# Patient Record
Sex: Female | Born: 1965 | Race: Black or African American | Hispanic: No | Marital: Single | Smoking: Former smoker
Health system: Southern US, Community
[De-identification: ages and names within clinical notes are randomized; demographics above are authoritative.]

## PROBLEM LIST (undated history)

## (undated) DIAGNOSIS — Z21 Asymptomatic human immunodeficiency virus [HIV] infection status: Secondary | ICD-10-CM

## (undated) DIAGNOSIS — Z87828 Personal history of other (healed) physical injury and trauma: Secondary | ICD-10-CM

## (undated) DIAGNOSIS — F259 Schizoaffective disorder, unspecified: Secondary | ICD-10-CM

## (undated) DIAGNOSIS — B181 Chronic viral hepatitis B without delta-agent: Secondary | ICD-10-CM

## (undated) DIAGNOSIS — F319 Bipolar disorder, unspecified: Secondary | ICD-10-CM

## (undated) DIAGNOSIS — B1920 Unspecified viral hepatitis C without hepatic coma: Secondary | ICD-10-CM

## (undated) DIAGNOSIS — B20 Human immunodeficiency virus [HIV] disease: Secondary | ICD-10-CM

## (undated) DIAGNOSIS — F71 Moderate intellectual disabilities: Secondary | ICD-10-CM

## (undated) HISTORY — PX: OTHER SURGICAL HISTORY: SHX169

## (undated) HISTORY — DX: Moderate intellectual disabilities: F71

## (undated) HISTORY — DX: Bipolar disorder, unspecified: F31.9

## (undated) HISTORY — DX: Schizoaffective disorder, unspecified: F25.9

## (undated) HISTORY — DX: Asymptomatic human immunodeficiency virus (hiv) infection status: Z21

## (undated) HISTORY — DX: Human immunodeficiency virus (HIV) disease: B20

## (undated) HISTORY — DX: Personal history of other (healed) physical injury and trauma: Z87.828

## (undated) HISTORY — DX: Chronic viral hepatitis B without delta-agent: B18.1

## (undated) HISTORY — DX: Unspecified viral hepatitis C without hepatic coma: B19.20

---

## 2009-01-09 ENCOUNTER — Emergency Department (HOSPITAL_COMMUNITY): Admission: EM | Admit: 2009-01-09 | Discharge: 2009-01-09 | Payer: Self-pay | Admitting: Emergency Medicine

## 2009-01-22 ENCOUNTER — Encounter: Payer: Self-pay | Admitting: Internal Medicine

## 2009-01-29 ENCOUNTER — Ambulatory Visit: Payer: Self-pay | Admitting: Internal Medicine

## 2009-01-29 DIAGNOSIS — F70 Mild intellectual disabilities: Secondary | ICD-10-CM | POA: Insufficient documentation

## 2009-01-29 DIAGNOSIS — B20 Human immunodeficiency virus [HIV] disease: Secondary | ICD-10-CM

## 2009-01-29 DIAGNOSIS — J301 Allergic rhinitis due to pollen: Secondary | ICD-10-CM | POA: Insufficient documentation

## 2009-01-29 DIAGNOSIS — B1910 Unspecified viral hepatitis B without hepatic coma: Secondary | ICD-10-CM | POA: Insufficient documentation

## 2009-01-29 DIAGNOSIS — F603 Borderline personality disorder: Secondary | ICD-10-CM | POA: Insufficient documentation

## 2009-01-29 DIAGNOSIS — F259 Schizoaffective disorder, unspecified: Secondary | ICD-10-CM | POA: Insufficient documentation

## 2009-01-29 LAB — CONVERTED CEMR LAB: HIV 1 RNA Quant: <48 copies/mL (ref <48)

## 2009-01-30 ENCOUNTER — Emergency Department (HOSPITAL_COMMUNITY): Admission: EM | Admit: 2009-01-30 | Discharge: 2009-01-30 | Payer: Self-pay | Admitting: Emergency Medicine

## 2009-01-30 ENCOUNTER — Emergency Department (HOSPITAL_COMMUNITY): Admission: EM | Admit: 2009-01-30 | Discharge: 2009-01-31 | Payer: Self-pay | Admitting: Emergency Medicine

## 2009-02-01 ENCOUNTER — Emergency Department (HOSPITAL_COMMUNITY): Admission: EM | Admit: 2009-02-01 | Discharge: 2009-02-02 | Payer: Self-pay | Admitting: Emergency Medicine

## 2009-02-03 LAB — CONVERTED CEMR LAB
AST: 13 units/L (ref 0–37)
Albumin: 4.3 g/dL (ref 3.5–5.2)
Alkaline Phosphatase: 41 units/L (ref 39–117)
BUN: 13 mg/dL (ref 6–23)
Eosinophils Absolute: 0.1 10*3/uL (ref 0.0–0.7)
Eosinophils Relative: 1 % (ref 0–5)
GC Probe Amp, Urine: NEGATIVE
HCT: 39.8 % (ref 36.0–46.0)
HDL: 49 mg/dL (ref >39)
HIV-1 antibody: POSITIVE — AB
HIV-2 Ab: NEGATIVE
HIV: REACTIVE
Hemoglobin, Urine: NEGATIVE
Hemoglobin: 13.5 g/dL (ref 12.0–15.0)
Hep B S Ab: POSITIVE — AB
LDL Cholesterol: 30 mg/dL (ref 0–99)
Leukocytes, UA: NEGATIVE
Lymphocytes Relative: 31 % (ref 12–46)
Lymphs Abs: 2.1 10*3/uL (ref 0.7–4.0)
MCV: 97.1 fL (ref 78.0–100.0)
Monocytes Absolute: 0.6 10*3/uL (ref 0.1–1.0)
Monocytes Relative: 8 % (ref 3–12)
Platelets: 201 10*3/uL (ref 150–400)
Potassium: 3.6 meq/L (ref 3.5–5.3)
Protein, ur: NEGATIVE mg/dL
Sodium: 142 meq/L (ref 135–145)
Total Bilirubin: 4.9 mg/dL — ABNORMAL HIGH (ref 0.3–1.2)
Total CHOL/HDL Ratio: 2
Triglycerides: 106 mg/dL (ref <150)
Urine Glucose: NEGATIVE mg/dL
Urobilinogen, UA: 1 (ref 0.0–1.0)
VLDL: 21 mg/dL (ref 0–40)
Valproic Acid Lvl: 68.8 ug/mL (ref 50.0–100.0)
WBC: 6.9 10*3/uL (ref 4.0–10.5)

## 2009-02-05 ENCOUNTER — Encounter: Payer: Self-pay | Admitting: Internal Medicine

## 2009-02-26 ENCOUNTER — Encounter: Payer: Self-pay | Admitting: Internal Medicine

## 2009-02-26 ENCOUNTER — Telehealth: Payer: Self-pay

## 2009-05-21 ENCOUNTER — Emergency Department (HOSPITAL_COMMUNITY): Admission: EM | Admit: 2009-05-21 | Discharge: 2009-05-22 | Payer: Self-pay | Admitting: Emergency Medicine

## 2009-07-30 ENCOUNTER — Encounter: Payer: Self-pay | Admitting: *Deleted

## 2009-11-21 ENCOUNTER — Emergency Department (HOSPITAL_COMMUNITY): Admission: EM | Admit: 2009-11-21 | Discharge: 2009-11-21 | Payer: Self-pay | Admitting: Emergency Medicine

## 2009-11-24 ENCOUNTER — Emergency Department (HOSPITAL_COMMUNITY): Admission: EM | Admit: 2009-11-24 | Discharge: 2009-11-24 | Payer: Self-pay | Admitting: Emergency Medicine

## 2010-08-23 NOTE — Miscellaneous (Signed)
  Clinical Lists Changes  Observations: Added new observation of PAYOR: Medicaid (07/30/2009 15:55)

## 2010-10-18 ENCOUNTER — Emergency Department: Payer: Self-pay | Admitting: Emergency Medicine

## 2010-10-30 LAB — RAPID URINE DRUG SCREEN, HOSP PERFORMED
Amphetamines: NOT DETECTED
Barbiturates: NOT DETECTED
Barbiturates: NOT DETECTED
Benzodiazepines: POSITIVE — AB
Tetrahydrocannabinol: NOT DETECTED

## 2010-10-30 LAB — URINALYSIS, ROUTINE W REFLEX MICROSCOPIC
Bilirubin Urine: NEGATIVE
Glucose, UA: NEGATIVE mg/dL
Hgb urine dipstick: NEGATIVE
Nitrite: NEGATIVE
Protein, ur: NEGATIVE mg/dL
Protein, ur: NEGATIVE mg/dL
Specific Gravity, Urine: >1.03 — ABNORMAL HIGH (ref 1.005–1.030)
Urobilinogen, UA: 0.2 mg/dL (ref 0.0–1.0)

## 2010-10-30 LAB — BASIC METABOLIC PANEL
BUN: 11 mg/dL (ref 6–23)
CO2: 28 mEq/L (ref 19–32)
Chloride: 105 mEq/L (ref 96–112)
Chloride: 106 mEq/L (ref 96–112)
Creatinine, Ser: 0.87 mg/dL (ref 0.4–1.2)
GFR calc Af Amer: >60 mL/min (ref >60)
Potassium: 3.5 mEq/L (ref 3.5–5.1)

## 2010-10-30 LAB — DIFFERENTIAL
Basophils Relative: 0 % (ref 0–1)
Eosinophils Absolute: 0 10*3/uL (ref 0.0–0.7)
Eosinophils Absolute: 0.1 10*3/uL (ref 0.0–0.7)
Eosinophils Relative: 2 % (ref 0–5)
Lymphs Abs: 1.7 10*3/uL (ref 0.7–4.0)
Lymphs Abs: 2.7 10*3/uL (ref 0.7–4.0)
Monocytes Absolute: 0.5 10*3/uL (ref 0.1–1.0)
Monocytes Relative: 8 % (ref 3–12)
Neutrophils Relative %: 67 % (ref 43–77)

## 2010-10-30 LAB — POCT I-STAT, CHEM 8
BUN: 10 mg/dL (ref 6–23)
Creatinine, Ser: 0.7 mg/dL (ref 0.4–1.2)
Glucose, Bld: 102 mg/dL — ABNORMAL HIGH (ref 70–99)
Potassium: 3.6 mEq/L (ref 3.5–5.1)
Sodium: 141 mEq/L (ref 135–145)

## 2010-10-30 LAB — T-HELPER CELL (CD4) - (RCID CLINIC ONLY)
CD4 % Helper T Cell: 50 % (ref 33–55)
CD4 T Cell Abs: 990 uL (ref 400–2700)

## 2010-10-30 LAB — CBC
HCT: 36.5 % (ref 36.0–46.0)
Hemoglobin: 12.9 g/dL (ref 12.0–15.0)
MCHC: 35.3 g/dL (ref 30.0–36.0)
MCV: 98.1 fL (ref 78.0–100.0)
MCV: 99.4 fL (ref 78.0–100.0)
Platelets: 205 10*3/uL (ref 150–400)
RBC: 3.67 MIL/uL — ABNORMAL LOW (ref 3.87–5.11)
WBC: 6.7 10*3/uL (ref 4.0–10.5)
WBC: 6.9 10*3/uL (ref 4.0–10.5)

## 2010-10-30 LAB — ETHANOL
Alcohol, Ethyl (B): <5 mg/dL (ref 0–10)
Alcohol, Ethyl (B): <5 mg/dL (ref 0–10)

## 2010-10-30 LAB — PREGNANCY, URINE: Preg Test, Ur: NEGATIVE

## 2010-10-31 LAB — CBC
HCT: 37.9 % (ref 36.0–46.0)
MCV: 98.4 fL (ref 78.0–100.0)
Platelets: 232 10*3/uL (ref 150–400)
RDW: 12.2 % (ref 11.5–15.5)

## 2010-10-31 LAB — URINALYSIS, ROUTINE W REFLEX MICROSCOPIC: Leukocytes, UA: NEGATIVE

## 2010-10-31 LAB — URINE MICROSCOPIC-ADD ON

## 2010-10-31 LAB — BASIC METABOLIC PANEL
BUN: 17 mg/dL (ref 6–23)
Chloride: 103 mEq/L (ref 96–112)
Creatinine, Ser: 0.79 mg/dL (ref 0.4–1.2)
GFR calc non Af Amer: >60 mL/min (ref >60)
Glucose, Bld: 129 mg/dL — ABNORMAL HIGH (ref 70–99)
Potassium: 3.6 mEq/L (ref 3.5–5.1)

## 2010-10-31 LAB — PREGNANCY, URINE: Preg Test, Ur: NEGATIVE

## 2010-10-31 LAB — URINE CULTURE

## 2010-10-31 LAB — RAPID URINE DRUG SCREEN, HOSP PERFORMED
Benzodiazepines: NOT DETECTED
Cocaine: NOT DETECTED
Opiates: NOT DETECTED
Tetrahydrocannabinol: NOT DETECTED

## 2010-10-31 LAB — DIFFERENTIAL
Basophils Absolute: 0 10*3/uL (ref 0.0–0.1)
Eosinophils Absolute: 0.1 10*3/uL (ref 0.0–0.7)
Eosinophils Relative: 2 % (ref 0–5)
Neutrophils Relative %: 63 % (ref 43–77)

## 2011-01-29 ENCOUNTER — Emergency Department: Payer: Self-pay | Admitting: Emergency Medicine

## 2011-03-08 ENCOUNTER — Emergency Department: Payer: Self-pay | Admitting: *Deleted

## 2011-03-23 ENCOUNTER — Emergency Department: Payer: Self-pay | Admitting: Emergency Medicine

## 2011-03-29 ENCOUNTER — Ambulatory Visit: Payer: Self-pay | Admitting: Family

## 2012-09-26 ENCOUNTER — Emergency Department: Payer: Self-pay | Admitting: Internal Medicine

## 2013-04-04 ENCOUNTER — Other Ambulatory Visit: Payer: Self-pay | Admitting: Obstetrics and Gynecology

## 2013-04-07 ENCOUNTER — Other Ambulatory Visit (HOSPITAL_COMMUNITY): Payer: Self-pay | Admitting: Family Medicine

## 2013-04-07 DIAGNOSIS — Z139 Encounter for screening, unspecified: Secondary | ICD-10-CM

## 2013-04-11 ENCOUNTER — Ambulatory Visit (HOSPITAL_COMMUNITY)
Admission: RE | Admit: 2013-04-11 | Discharge: 2013-04-11 | Disposition: A | Discharge status: Home / Self Care | Payer: Medicaid Other | Source: Ambulatory Visit | Attending: Family Medicine | Admitting: Family Medicine

## 2013-04-11 DIAGNOSIS — Z1231 Encounter for screening mammogram for malignant neoplasm of breast: Secondary | ICD-10-CM | POA: Insufficient documentation

## 2013-04-11 DIAGNOSIS — Z139 Encounter for screening, unspecified: Secondary | ICD-10-CM

## 2013-04-18 ENCOUNTER — Other Ambulatory Visit (HOSPITAL_COMMUNITY)
Admission: RE | Admit: 2013-04-18 | Discharge: 2013-04-18 | Disposition: A | Discharge status: Home / Self Care | Payer: Medicaid Other | Source: Ambulatory Visit | Attending: Obstetrics and Gynecology | Admitting: Obstetrics and Gynecology

## 2013-04-18 ENCOUNTER — Ambulatory Visit (INDEPENDENT_AMBULATORY_CARE_PROVIDER_SITE_OTHER): Payer: Medicaid Other | Admitting: Obstetrics and Gynecology

## 2013-04-18 ENCOUNTER — Encounter: Payer: Self-pay | Admitting: Obstetrics and Gynecology

## 2013-04-18 VITALS — BP 130/82 | Ht 62.0 in | Wt 192.0 lb

## 2013-04-18 DIAGNOSIS — Z01419 Encounter for gynecological examination (general) (routine) without abnormal findings: Secondary | ICD-10-CM

## 2013-04-18 DIAGNOSIS — Z113 Encounter for screening for infections with a predominantly sexual mode of transmission: Secondary | ICD-10-CM | POA: Insufficient documentation

## 2013-04-18 DIAGNOSIS — Z0000 Encounter for general adult medical examination without abnormal findings: Secondary | ICD-10-CM

## 2013-04-18 DIAGNOSIS — Z1212 Encounter for screening for malignant neoplasm of rectum: Secondary | ICD-10-CM

## 2013-04-18 DIAGNOSIS — Z1151 Encounter for screening for human papillomavirus (HPV): Secondary | ICD-10-CM | POA: Insufficient documentation

## 2013-04-18 LAB — HEMOCCULT GUIAC POC 1CARD (OFFICE)

## 2013-04-18 NOTE — Progress Notes (Addendum)
Patient ID: Karen Mcguire, female   DOB: Aug 28, 1965, 47 y.o.   MRN: [id number] Pt seen in office earlier today. Pt's nurse came by the office and asked if Dr. Emelda Fear could refer the pt to and Infection control doctor around here. The nurse stated that it was time for her blood work and check up. She also stated the pt has been seen every 6 months in [place]. She stated that the home that the pt lives in doesn't want to drive that far if it can be helped. I spoke with Dr. Emelda Fear and he advised that when the pt is seen again he would take care of it then.   Wilburn [phone] Nurse.  I left a message for Karen Mcguire to call me back on Monday.

## 2013-04-18 NOTE — Progress Notes (Signed)
Patient ID: Karen Mcguire, Karen Mcguire   Assessment:  Annual Gyn Exam hiv pos hcv pos VULVAR SKINN TAG, FOR FUTURE REMOVAL CERVICAL LESION WILL BX IN FUTURE, PAP TODAY   Plan:  1. pap smear done, next pap due yearly 2. return annually or prn 3    Annual mammogram advised Subjective:  Karen Mcguire is a 47 y.o. female No obstetric history on file. who presents for annual exam. No LMP recorded. Patient has had an injection. The patient has complaints today of none  The following portions of the patient's history were reviewed and updated as appropriate: allergies, current medications, past family history, past medical history, past social history, past surgical history and problem list.  Review of Systems Constitutional: negative Gastrointestinal: negative Genitourinary: has a bump on rt labia majora  Objective:  BP 130/82  Ht 5\' 2"  (1.575 m)  Wt 192 lb (87.091 kg)  BMI 35.11 kg/m2   BMI: Body mass index is 35.11 kg/(m^2).  General Appearance: Alert, appropriate appearance for age. No acute distress HEENT: Grossly normal Neck / Thyroid:  Cardiovascular: RRR; normal S1, S2, no murmur Lungs: CTA bilaterally Back: No CVAT Breast Exam: No masses or nodes.No dimpling, nipple retraction or discharge. Gastrointestinal: Soft, non-tender, no masses or organomegaly Pelvic Exam: Vulva and vagina appear normal. Bimanual exam reveals normal uterus and adnexa. Cervix: GROWTH ON LEFT OF CX 3 OCLOCK, ? NABOTHIAN CYST, VSCONDYLOMA Rectovaginal: normal rectal, no masses and guaiac negative stool obtained Lymphatic Exam: Non-palpable nodes in neck, clavicular, axillary, or inguinal regions Skin: no rash or abnormalities Neurologic: Normal gait and speech, no tremor  Psychiatric: Alert and oriented, appropriate affect.  Urinalysis:Not done  Christin Bach. MD Pgr [phone] 1:52 PM

## 2013-04-18 NOTE — Patient Instructions (Signed)
RETURN FOR BIOPSY

## 2013-05-08 ENCOUNTER — Ambulatory Visit (INDEPENDENT_AMBULATORY_CARE_PROVIDER_SITE_OTHER): Payer: Medicaid Other | Admitting: Obstetrics and Gynecology

## 2013-05-08 VITALS — BP 110/80 | Ht 62.0 in | Wt 195.0 lb

## 2013-05-08 DIAGNOSIS — A630 Anogenital (venereal) warts: Secondary | ICD-10-CM

## 2013-05-08 DIAGNOSIS — Z3202 Encounter for pregnancy test, result negative: Secondary | ICD-10-CM

## 2013-05-08 DIAGNOSIS — Z3200 Encounter for pregnancy test, result unknown: Secondary | ICD-10-CM

## 2013-05-08 DIAGNOSIS — D280 Benign neoplasm of vulva: Secondary | ICD-10-CM

## 2013-05-08 NOTE — Progress Notes (Signed)
Patient ID: Karen Mcguire, female   DOB: 02-01-1966, 47 y.o.   MRN: [id number] Pt here today for vulvar and cervical biopsy. Pt has a negative UPT. Pt needs to have a referral to Dr. Luciana Axe for HIV/ Hep B chronic. Phone number [phone].  VULVAR BIOPSY NOTE, also cervix biopsy The indications for vulvar biopsy (rule out neoplasia,  diagnosis) were reviewed.   Risks of the biopsy including pain, bleeding, infection, inadequate specimen, scarring and need for additional procedures  were discussed. The patient stated understanding and agreed to undergo procedure today. Consent was signed,  time out performed.  The patient's vulva was prepped with Betadine. 1% lidocaine with epinephrine was injected into right labia majora and cervix. A biopsy was done, biopsy tissue was picked up with sterile forceps and sterile scissors were used to excise the lesion.  Small bleeding was noted and hemostasis was achieved using silver nitrate sticks.  The patient tolerated the procedure well. Post-procedure instructions  (pelvic rest for one week) were given to the patient. The patient is to call with heavy bleeding, fever greater than 100.4, foul smelling vaginal discharge or other concerns. The patient will be return to clinic in two weeks for discussion of results.  Cervix biopsy under local removed the entire 1 cm x 1 cm x 5 mm thick lesion on cervix at 2 oclock in pieces. Monsels applied.

## 2013-05-08 NOTE — Patient Instructions (Addendum)
Expect results in 1 week. Biopsies of vulva and cervix performed

## 2013-05-14 ENCOUNTER — Telehealth: Payer: Self-pay | Admitting: *Deleted

## 2013-05-14 NOTE — Telephone Encounter (Signed)
Message copied by Criss Alvine on Wed May 14, 2013  4:55 PM ------      Message from: Tilda Burrow      Created: Wed May 14, 2013  6:40 AM       BIOPSY BENIGN ON CERVIX, VULVAR WART CONDYLOMA, REMOVED ENTIRELY.      PLEASE NOTIFY PT OR CAREGIVER ------

## 2013-05-14 NOTE — Telephone Encounter (Signed)
Spoke with Oliva Bustard, pt caregiver, informed per Dr. Emelda Fear cervical biopsy benign, and vulvar wart, condyloma and entirely removed.

## 2013-06-06 ENCOUNTER — Ambulatory Visit (INDEPENDENT_AMBULATORY_CARE_PROVIDER_SITE_OTHER): Payer: Medicaid Other | Admitting: Obstetrics and Gynecology

## 2013-06-06 ENCOUNTER — Encounter: Payer: Self-pay | Admitting: Obstetrics and Gynecology

## 2013-06-06 ENCOUNTER — Encounter (INDEPENDENT_AMBULATORY_CARE_PROVIDER_SITE_OTHER): Payer: Self-pay

## 2013-06-06 VITALS — BP 120/80 | Ht 64.0 in | Wt 197.0 lb

## 2013-06-06 DIAGNOSIS — A630 Anogenital (venereal) warts: Secondary | ICD-10-CM

## 2013-06-06 NOTE — Progress Notes (Signed)
Pathology:  Vulvar condyloma only                  Cervix biopsies:  No dysplasia  A: no vulvar pathology Followup annual pap

## 2013-06-06 NOTE — Patient Instructions (Signed)
Your biopsies were benign.  The cervix biopsies were completely normal     The spot on  Your bottom were condyloma([place]) with NO cancer.

## 2013-07-03 ENCOUNTER — Telehealth: Payer: Self-pay | Admitting: *Deleted

## 2013-07-04 NOTE — Telephone Encounter (Signed)
Phone message left with Dr comer's office, requesting patient be contacted for followup re: HIV meds. Patient contact numbers left with message.

## 2013-07-07 ENCOUNTER — Telehealth: Payer: Self-pay | Admitting: *Deleted

## 2013-07-07 NOTE — Telephone Encounter (Signed)
Dr. Christin Bach, patient's GYN at [facility], called requesting an appointment for patient who is HIV+/HepB+.  Patient had been receiving care at [facility], but would like to transfer care to [facility]. Andree Coss, RN

## 2013-07-09 ENCOUNTER — Encounter (HOSPITAL_COMMUNITY): Payer: Self-pay | Admitting: Emergency Medicine

## 2013-07-09 ENCOUNTER — Emergency Department (HOSPITAL_COMMUNITY)
Admission: EM | Admit: 2013-07-09 | Discharge: 2013-07-11 | Disposition: A | Discharge status: Home / Self Care | Payer: Medicaid Other | Attending: Emergency Medicine | Admitting: Emergency Medicine

## 2013-07-09 DIAGNOSIS — Z79899 Other long term (current) drug therapy: Secondary | ICD-10-CM | POA: Insufficient documentation

## 2013-07-09 DIAGNOSIS — R443 Hallucinations, unspecified: Secondary | ICD-10-CM

## 2013-07-09 DIAGNOSIS — F319 Bipolar disorder, unspecified: Secondary | ICD-10-CM | POA: Insufficient documentation

## 2013-07-09 DIAGNOSIS — F259 Schizoaffective disorder, unspecified: Secondary | ICD-10-CM

## 2013-07-09 DIAGNOSIS — Z3202 Encounter for pregnancy test, result negative: Secondary | ICD-10-CM | POA: Insufficient documentation

## 2013-07-09 DIAGNOSIS — F17200 Nicotine dependence, unspecified, uncomplicated: Secondary | ICD-10-CM | POA: Insufficient documentation

## 2013-07-09 DIAGNOSIS — Z21 Asymptomatic human immunodeficiency virus [HIV] infection status: Secondary | ICD-10-CM | POA: Insufficient documentation

## 2013-07-09 DIAGNOSIS — Z8619 Personal history of other infectious and parasitic diseases: Secondary | ICD-10-CM | POA: Insufficient documentation

## 2013-07-09 DIAGNOSIS — F603 Borderline personality disorder: Secondary | ICD-10-CM | POA: Diagnosis present

## 2013-07-09 LAB — COMPREHENSIVE METABOLIC PANEL
ALT: 11 U/L (ref 0–35)
Alkaline Phosphatase: 51 U/L (ref 39–117)
BUN: 7 mg/dL (ref 6–23)
CO2: 22 mEq/L (ref 19–32)
Calcium: 9.4 mg/dL (ref 8.4–10.5)
Chloride: 103 mEq/L (ref 96–112)
Creatinine, Ser: 0.75 mg/dL (ref 0.50–1.10)
GFR calc Af Amer: >90 mL/min (ref >90)
GFR calc non Af Amer: >90 mL/min (ref >90)
Glucose, Bld: 109 mg/dL — ABNORMAL HIGH (ref 70–99)
Potassium: 3.7 mEq/L (ref 3.5–5.1)
Total Bilirubin: 0.2 mg/dL — ABNORMAL LOW (ref 0.3–1.2)

## 2013-07-09 LAB — SALICYLATE LEVEL: Salicylate Lvl: <2 mg/dL — ABNORMAL LOW (ref 2.8–20.0)

## 2013-07-09 LAB — CBC WITH DIFFERENTIAL/PLATELET
Basophils Relative: 0 % (ref 0–1)
Eosinophils Relative: 1 % (ref 0–5)
HCT: 41.2 % (ref 36.0–46.0)
Hemoglobin: 14 g/dL (ref 12.0–15.0)
Lymphocytes Relative: 31 % (ref 12–46)
Lymphs Abs: 2.4 10*3/uL (ref 0.7–4.0)
MCHC: 34 g/dL (ref 30.0–36.0)
MCV: 97.4 fL (ref 78.0–100.0)
Monocytes Absolute: 0.6 10*3/uL (ref 0.1–1.0)
Monocytes Relative: 8 % (ref 3–12)
Neutro Abs: 4.7 10*3/uL (ref 1.7–7.7)
RBC: 4.23 MIL/uL (ref 3.87–5.11)
RDW: 13.1 % (ref 11.5–15.5)
WBC: 7.9 10*3/uL (ref 4.0–10.5)

## 2013-07-09 LAB — RAPID URINE DRUG SCREEN, HOSP PERFORMED
Cocaine: NOT DETECTED
Opiates: NOT DETECTED
Tetrahydrocannabinol: NOT DETECTED

## 2013-07-09 LAB — URINALYSIS, ROUTINE W REFLEX MICROSCOPIC
Glucose, UA: NEGATIVE mg/dL
Ketones, ur: NEGATIVE mg/dL
Leukocytes, UA: NEGATIVE
Nitrite: NEGATIVE
Protein, ur: NEGATIVE mg/dL
pH: 6 (ref 5.0–8.0)

## 2013-07-09 LAB — URINE MICROSCOPIC-ADD ON

## 2013-07-09 LAB — LITHIUM LEVEL: Lithium Lvl: <0.25 mEq/L — ABNORMAL LOW (ref 0.80–1.40)

## 2013-07-09 LAB — POCT PREGNANCY, URINE: Preg Test, Ur: NEGATIVE

## 2013-07-09 LAB — ACETAMINOPHEN LEVEL: Acetaminophen (Tylenol), Serum: <15 ug/mL (ref 10–30)

## 2013-07-09 MED ORDER — DIVALPROEX SODIUM 250 MG PO DR TAB
DELAYED_RELEASE_TABLET | ORAL | Status: AC
  Filled 2013-07-09: qty 1

## 2013-07-09 MED ORDER — LORAZEPAM 1 MG PO TABS
1.0000 mg | ORAL_TABLET | Freq: Three times a day (TID) | ORAL | Status: DC
  Administered 2013-07-10 – 2013-07-11 (×4): 1 mg via ORAL
  Filled 2013-07-09 (×4): qty 1

## 2013-07-09 MED ORDER — DOLUTEGRAVIR SODIUM 50 MG PO TABS
50.0000 mg | ORAL_TABLET | Freq: Every day | ORAL | Status: DC
  Administered 2013-07-10 – 2013-07-11 (×2): 50 mg via ORAL
  Filled 2013-07-09 (×3): qty 1

## 2013-07-09 MED ORDER — LORAZEPAM 2 MG/ML IJ SOLN
INTRAMUSCULAR | Status: AC
  Administered 2013-07-09: 2 mg via INTRAMUSCULAR
  Filled 2013-07-09: qty 1

## 2013-07-09 MED ORDER — LORAZEPAM 2 MG/ML IJ SOLN
2.0000 mg | Freq: Once | INTRAMUSCULAR | Status: AC
  Administered 2013-07-09: 2 mg via INTRAMUSCULAR

## 2013-07-09 MED ORDER — ALUM & MAG HYDROXIDE-SIMETH 200-200-20 MG/5ML PO SUSP: 30.0000 mL | ORAL | Status: DC | PRN

## 2013-07-09 MED ORDER — HALOPERIDOL 5 MG PO TABS
ORAL_TABLET | ORAL | Status: AC
  Filled 2013-07-09: qty 2

## 2013-07-09 MED ORDER — LORAZEPAM 1 MG PO TABS
ORAL_TABLET | ORAL | Status: AC
  Filled 2013-07-09: qty 1

## 2013-07-09 MED ORDER — IBUPROFEN 400 MG PO TABS: 600.0000 mg | ORAL_TABLET | Freq: Three times a day (TID) | ORAL | Status: DC | PRN

## 2013-07-09 MED ORDER — NICOTINE 21 MG/24HR TD PT24
21.0000 mg | MEDICATED_PATCH | Freq: Every day | TRANSDERMAL | Status: DC
  Administered 2013-07-09 – 2013-07-11 (×3): 21 mg via TRANSDERMAL
  Filled 2013-07-09 (×2): qty 1

## 2013-07-09 MED ORDER — OLANZAPINE 5 MG PO TABS
20.0000 mg | ORAL_TABLET | Freq: Every day | ORAL | Status: DC
  Administered 2013-07-09 – 2013-07-10 (×2): 20 mg via ORAL
  Filled 2013-07-09: qty 2
  Filled 2013-07-09 (×2): qty 4
  Filled 2013-07-09: qty 2

## 2013-07-09 MED ORDER — LAMIVUDINE 150 MG PO TABS
300.0000 mg | ORAL_TABLET | Freq: Every day | ORAL | Status: DC
  Administered 2013-07-10 – 2013-07-11 (×2): 300 mg via ORAL
  Filled 2013-07-09 (×3): qty 2

## 2013-07-09 MED ORDER — DIVALPROEX SODIUM 250 MG PO DR TAB
1000.0000 mg | DELAYED_RELEASE_TABLET | Freq: Every day | ORAL | Status: DC
  Administered 2013-07-09 – 2013-07-10 (×2): 1000 mg via ORAL
  Filled 2013-07-09 (×2): qty 4

## 2013-07-09 MED ORDER — NICOTINE 21 MG/24HR TD PT24
MEDICATED_PATCH | TRANSDERMAL | Status: AC
  Filled 2013-07-09: qty 1

## 2013-07-09 MED ORDER — HALOPERIDOL LACTATE 5 MG/ML IJ SOLN
INTRAMUSCULAR | Status: AC
  Administered 2013-07-09: 5 mg
  Filled 2013-07-09: qty 1

## 2013-07-09 MED ORDER — ONDANSETRON HCL 4 MG PO TABS: 4.0000 mg | ORAL_TABLET | Freq: Three times a day (TID) | ORAL | Status: DC | PRN

## 2013-07-09 MED ORDER — BENZTROPINE MESYLATE 1 MG PO TABS: 1.0000 mg | ORAL_TABLET | Freq: Four times a day (QID) | ORAL | Status: DC | PRN

## 2013-07-09 MED ORDER — TRIHEXYPHENIDYL HCL 2 MG PO TABS
2.0000 mg | ORAL_TABLET | Freq: Three times a day (TID) | ORAL | Status: DC
  Administered 2013-07-09 – 2013-07-11 (×4): 2 mg via ORAL
  Filled 2013-07-09 (×12): qty 1

## 2013-07-09 MED ORDER — LITHIUM CARBONATE 300 MG PO CAPS
300.0000 mg | ORAL_CAPSULE | Freq: Two times a day (BID) | ORAL | Status: DC
  Administered 2013-07-09 – 2013-07-11 (×4): 300 mg via ORAL
  Filled 2013-07-09 (×8): qty 1

## 2013-07-09 MED ORDER — DIVALPROEX SODIUM 250 MG PO DR TAB
250.0000 mg | DELAYED_RELEASE_TABLET | Freq: Every day | ORAL | Status: DC
  Administered 2013-07-10: 250 mg via ORAL
  Filled 2013-07-09: qty 1

## 2013-07-09 MED ORDER — HALOPERIDOL 5 MG PO TABS
10.0000 mg | ORAL_TABLET | Freq: Every day | ORAL | Status: DC
  Administered 2013-07-09 – 2013-07-10 (×2): 10 mg via ORAL
  Filled 2013-07-09 (×2): qty 2

## 2013-07-09 MED ORDER — ABACAVIR SULFATE 300 MG PO TABS
600.0000 mg | ORAL_TABLET | Freq: Every day | ORAL | Status: DC
  Administered 2013-07-10 – 2013-07-11 (×2): 600 mg via ORAL
  Filled 2013-07-09 (×4): qty 2

## 2013-07-09 MED ORDER — HYDROXYZINE PAMOATE 25 MG PO CAPS
25.0000 mg | ORAL_CAPSULE | Freq: Every day | ORAL | Status: DC
  Administered 2013-07-10: 25 mg via ORAL
  Filled 2013-07-09 (×2): qty 1
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
## 2013-07-09 NOTE — ED Notes (Addendum)
Patient has been cooperative, has taken her scheduled meds - given part of her 10:00 pm meds while she is cooperative and with knowledge her levels are low and she has not taken her daily meds this am.  Patient has a "stuffed animal - Angie Fava - she has named her RosieBelle
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
## 2013-07-09 NOTE — BH Assessment (Signed)
Writer contacted [facility] to schedule a TTS consult for this patient. Writer spoke to patient's nurse-Tiffany and scheduled the TTS for 1800. Writer asked nurse to place machine in patient's room.  Writer also contacted the examining physician-Kristen Ward for clinical information. My colleague Renda Rolls will complete this patients assessment.

## 2013-07-09 NOTE — ED Notes (Signed)
Pt back in bed.  Still hollaring out some.    Staying in bed.
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
## 2013-07-09 NOTE — ED Notes (Signed)
Pt will not allow NT to get vitals

## 2013-07-09 NOTE — ED Notes (Signed)
Telepsch in progress.

## 2013-07-09 NOTE — ED Notes (Signed)
Patient requesting something to eat, states she did not get but two bites of food earlier today.  Verified with sitter that patient did not get to complete her meal earlier this evening,  Provided with meat and two vegetables, roll, Sprite zero.  Patient eating without complaints.

## 2013-07-09 NOTE — ED Notes (Signed)
Pt hollaring .  Coming running out of room.  Ran to nurses station and trying to use telephone.  Hollaring.  Orders for haldol and ativan.  Pt layed down in floor at nurses station and continues to hollar.  Injections given at nurses station.

## 2013-07-09 NOTE — ED Notes (Signed)
After talking with patient she states she would like to take her medicines and also wants something to help her need for cigarette - advised will bring her meds and a nicotine patch.

## 2013-07-09 NOTE — BH Assessment (Signed)
Patient sts that she does not know the contact information for the group home.  Writer contacted [facility] in [place], [place] #[phone] for collateral information. This was not a viable number.   Writer also contacted another [phone] and this number rang numerous times with a voicemail stating this was [address].   Writer then contacted the contact # on patient's demographic sheet [phone]. This number just rang several times without an option to leave a voicemail.  Another number found was [phone] and this # was not viable.  The address is listed below: [address], [place], [place] [address]
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
## 2013-07-10 NOTE — ED Notes (Signed)
Per Leslie,CN, "BH will be Chief Technology Officer consult".

## 2013-07-10 NOTE — ED Notes (Signed)
TTS wasn't done at scheduled time.  They to call back and reschedule.

## 2013-07-10 NOTE — BH Assessment (Signed)
Consulted with EDP Dr.Zammitt. Informed Dr.Zamitt that Karen [facility] has been informed of pt's clinical presentation from TTS assessment and Karen Mcguire is requesting that EDP order SW consult as SW can evaluate pt's placement situation as TTS was unable to reach group home staff to obtain collateral to determine if patient can return back to group home. Once SW consult is complete if EDP deems that patient warrants inpatient treatment a TelePsych consult with Psychiatrist or Extender can be ordered to further determine recommendations for medications or placement recommendations.   Karen Peach, MS, LCASA Assessment Counselor

## 2013-07-10 NOTE — ED Notes (Signed)
Pt up, took shower, adl's performed.  nad noted

## 2013-07-10 NOTE — ED Provider Notes (Signed)
Social worker consulted to evaluate whether pt can go back to group home.   After social work consult complete then need to do another pysc consult for medication changes  Benny Lennert, MD 07/10/13 [phone]

## 2013-07-10 NOTE — ED Notes (Signed)
Has been sleeping until now, ambulated to bathroom - patient requested change of scrub pants.  Given.  Returned to her room and back to sleep.

## 2013-07-10 NOTE — ED Notes (Signed)
[facility] called states that they are to do another Telepsych consult to evaluate pt, then decide whether to send her back to her group home or not.  Telepsych scheduled for 1145.

## 2013-07-10 NOTE — ED Notes (Signed)
TTS consult scheduled for 1145.  Nurse informed.  Monitor  in the rm.

## 2013-07-10 NOTE — ED Notes (Signed)
[facility] called, ready for telepsych

## 2013-07-10 NOTE — ED Notes (Signed)
Pt ambulated to restroom & returned to room w/ no complications. 
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
## 2013-07-10 NOTE — Clinical Social Work Note (Signed)
CSW received call from [facility] ([facility] - assessment will contact group home to determine discharge disposition.  CSW signing off but will stand by to assist if needed.  Santa Genera, LCSW Clinical Social Worker [phone])

## 2013-07-11 ENCOUNTER — Encounter (HOSPITAL_COMMUNITY): Payer: Self-pay | Admitting: Psychiatry

## 2013-07-11 DIAGNOSIS — F259 Schizoaffective disorder, unspecified: Secondary | ICD-10-CM

## 2013-07-11 MED ORDER — NICOTINE 21 MG/24HR TD PT24
MEDICATED_PATCH | TRANSDERMAL | Status: AC
  Filled 2013-07-11: qty 1

## 2013-07-11 MED ORDER — HYDROXYZINE HCL 25 MG PO TABS: 25.0000 mg | ORAL_TABLET | Freq: Every day | ORAL | Status: DC

## 2013-07-11 NOTE — ED Provider Notes (Signed)
Pt now feels improved She has been seen by telepsych and cleared for d/c home (this was approved by dr Dub Mikes) will go back to group home Pt feels comfortable with plan BP 113/87  Pulse 93  Temp(Src) 98.3 F (36.8 C) (Oral)  Resp 20  Ht 5\' 3"  (1.6 m)  Wt 200 lb (90.719 kg)  BMI 35.44 kg/m2  SpO2 98%   Joya Gaskins, MD 07/11/13 1147

## 2013-07-11 NOTE — ED Notes (Signed)
Pt resting in bed. Sitter remains in site of the pt. NAD noted.

## 2013-07-11 NOTE — ED Notes (Signed)
Spoke with BH. Per staff nurse from yesterday, pt had remarkably improved from the day before. It up questionable as if at this time, pt needs inpatient placement. TTS to be repeated today by an extender.

## 2013-07-11 NOTE — ED Notes (Signed)
Pt resting calmly w/ eyes closed. Rise & fall of the chest noted. Sitter at bedside. Bed in low position, side rails up x2. NAD noted at this time.  

## 2013-07-11 NOTE — ED Notes (Signed)
[facility] called to [facility]. Sheriff called for transportation. IVC papers have been rescinded by Dr. Bebe Shaggy. Pt informed of plan of care to return to group home and she agreed and verbalized understanding.
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
## 2013-08-06 ENCOUNTER — Telehealth: Payer: Self-pay

## 2013-08-06 NOTE — Telephone Encounter (Signed)
Referral received from Dr Mallory Shirk for HIV transfer.  Patient is currently in care at [facility] and is having transportation issues.  I spoke with Leafy Half the manager of the [facility] where the patient  resides to schedule the appointment. [phone])  Appointment given for intake.   Laverle Patter, RN

## 2013-08-19 ENCOUNTER — Ambulatory Visit: Payer: Self-pay

## 2013-09-11 ENCOUNTER — Ambulatory Visit: Payer: Self-pay

## 2013-10-07 ENCOUNTER — Other Ambulatory Visit (HOSPITAL_COMMUNITY): Payer: Self-pay | Admitting: Family Medicine

## 2013-10-07 DIAGNOSIS — R229 Localized swelling, mass and lump, unspecified: Principal | ICD-10-CM

## 2013-10-07 DIAGNOSIS — IMO0002 Reserved for concepts with insufficient information to code with codable children: Secondary | ICD-10-CM

## 2013-10-09 ENCOUNTER — Ambulatory Visit (INDEPENDENT_AMBULATORY_CARE_PROVIDER_SITE_OTHER): Payer: Medicaid Other

## 2013-10-09 ENCOUNTER — Other Ambulatory Visit (HOSPITAL_COMMUNITY)
Admission: RE | Admit: 2013-10-09 | Discharge: 2013-10-09 | Disposition: A | Discharge status: Home / Self Care | Payer: Medicaid Other | Source: Ambulatory Visit | Attending: Infectious Diseases | Admitting: Infectious Diseases

## 2013-10-09 DIAGNOSIS — Z23 Encounter for immunization: Secondary | ICD-10-CM | POA: Diagnosis not present

## 2013-10-09 DIAGNOSIS — B20 Human immunodeficiency virus [HIV] disease: Secondary | ICD-10-CM | POA: Diagnosis present

## 2013-10-09 DIAGNOSIS — Z113 Encounter for screening for infections with a predominantly sexual mode of transmission: Secondary | ICD-10-CM | POA: Insufficient documentation

## 2013-10-09 DIAGNOSIS — B1920 Unspecified viral hepatitis C without hepatic coma: Secondary | ICD-10-CM

## 2013-10-09 LAB — COMPLETE METABOLIC PANEL WITH GFR
ALT: 9 U/L (ref 0–35)
AST: 13 U/L (ref 0–37)
Albumin: 4.2 g/dL (ref 3.5–5.2)
Alkaline Phosphatase: 51 U/L (ref 39–117)
BILIRUBIN TOTAL: 0.3 mg/dL (ref 0.2–1.2)
BUN: 9 mg/dL (ref 6–23)
CO2: 24 mEq/L (ref 19–32)
CREATININE: 0.81 mg/dL (ref 0.50–1.10)
Calcium: 8.9 mg/dL (ref 8.4–10.5)
Chloride: 105 mEq/L (ref 96–112)
GFR, Est African American: >89 mL/min
GFR, Est Non African American: 87 mL/min
Glucose, Bld: 79 mg/dL (ref 70–99)
Potassium: 4.5 mEq/L (ref 3.5–5.3)
Sodium: 138 mEq/L (ref 135–145)
Total Protein: 7.5 g/dL (ref 6.0–8.3)

## 2013-10-09 LAB — CBC WITH DIFFERENTIAL/PLATELET
BASOS ABS: 0 10*3/uL (ref 0.0–0.1)
Basophils Relative: 0 % (ref 0–1)
EOS ABS: 0.1 10*3/uL (ref 0.0–0.7)
EOS PCT: 1 % (ref 0–5)
HEMATOCRIT: 40 % (ref 36.0–46.0)
Hemoglobin: 13.7 g/dL (ref 12.0–15.0)
Lymphocytes Relative: 31 % (ref 12–46)
Lymphs Abs: 2.4 10*3/uL (ref 0.7–4.0)
MCH: 32.3 pg (ref 26.0–34.0)
MCHC: 34.3 g/dL (ref 30.0–36.0)
MCV: 94.3 fL (ref 78.0–100.0)
MONO ABS: 0.5 10*3/uL (ref 0.1–1.0)
Monocytes Relative: 7 % (ref 3–12)
Neutro Abs: 4.8 10*3/uL (ref 1.7–7.7)
Neutrophils Relative %: 61 % (ref 43–77)
PLATELETS: 249 10*3/uL (ref 150–400)
RBC: 4.24 MIL/uL (ref 3.87–5.11)
RDW: 14.8 % (ref 11.5–15.5)
WBC: 7.8 10*3/uL (ref 4.0–10.5)

## 2013-10-09 LAB — LIPID PANEL
Cholesterol: 117 mg/dL (ref 0–200)
HDL: 42 mg/dL (ref >39)
LDL CALC: 27 mg/dL (ref 0–99)
TRIGLYCERIDES: 242 mg/dL — AB (ref <150)
Total CHOL/HDL Ratio: 2.8 Ratio
VLDL: 48 mg/dL — AB (ref 0–40)

## 2013-10-09 LAB — HEPATITIS B CORE ANTIBODY, TOTAL: Hep B Core Total Ab: REACTIVE — AB

## 2013-10-09 LAB — RPR

## 2013-10-09 LAB — HEPATITIS B SURFACE ANTIGEN: HEP B S AG: NEGATIVE

## 2013-10-09 LAB — HEPATITIS C ANTIBODY: HCV AB: NEGATIVE

## 2013-10-09 LAB — HEPATITIS A ANTIBODY, TOTAL: Hep A Total Ab: REACTIVE — AB

## 2013-10-09 LAB — HEPATITIS B SURFACE ANTIBODY,QUALITATIVE: Hep B S Ab: POSITIVE — AB

## 2013-10-10 LAB — HIV-1 RNA ULTRAQUANT REFLEX TO GENTYP+
HIV 1 RNA Quant: 30 copies/mL — ABNORMAL HIGH (ref <20)
HIV-1 RNA Quant, Log: 1.48 {Log} — ABNORMAL HIGH (ref <1.30)

## 2013-10-10 LAB — URINALYSIS
BILIRUBIN URINE: NEGATIVE
Glucose, UA: NEGATIVE mg/dL
Hgb urine dipstick: NEGATIVE
KETONES UR: NEGATIVE mg/dL
Leukocytes, UA: NEGATIVE
Nitrite: NEGATIVE
PH: 8 (ref 5.0–8.0)
Protein, ur: NEGATIVE mg/dL
Specific Gravity, Urine: 1.012 (ref 1.005–1.030)
UROBILINOGEN UA: 0.2 mg/dL (ref 0.0–1.0)

## 2013-10-10 LAB — T-HELPER CELL (CD4) - (RCID CLINIC ONLY)
CD4 % Helper T Cell: 46 % (ref 33–55)
CD4 T CELL ABS: 1130 /uL (ref 400–2700)

## 2013-10-10 LAB — URINE CYTOLOGY ANCILLARY ONLY
Chlamydia: NEGATIVE
Neisseria Gonorrhea: NEGATIVE

## 2013-10-10 LAB — HEPATITIS C RNA QUANTITATIVE: HCV Quantitative: NOT DETECTED IU/mL (ref <15)
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
## 2013-10-11 LAB — TB SKIN TEST
Induration: 0 mm
TB Skin Test: NEGATIVE

## 2013-10-15 ENCOUNTER — Other Ambulatory Visit (HOSPITAL_COMMUNITY): Payer: Self-pay | Admitting: Family Medicine

## 2013-10-15 ENCOUNTER — Ambulatory Visit (HOSPITAL_COMMUNITY)
Admission: RE | Admit: 2013-10-15 | Discharge: 2013-10-15 | Disposition: A | Discharge status: Home / Self Care | Payer: Medicaid Other | Source: Ambulatory Visit | Attending: Family Medicine | Admitting: Family Medicine

## 2013-10-15 DIAGNOSIS — B1920 Unspecified viral hepatitis C without hepatic coma: Secondary | ICD-10-CM | POA: Insufficient documentation

## 2013-10-15 DIAGNOSIS — IMO0002 Reserved for concepts with insufficient information to code with codable children: Secondary | ICD-10-CM

## 2013-10-15 DIAGNOSIS — R229 Localized swelling, mass and lump, unspecified: Principal | ICD-10-CM

## 2013-10-15 DIAGNOSIS — N63 Unspecified lump in unspecified breast: Secondary | ICD-10-CM | POA: Insufficient documentation

## 2013-10-17 LAB — HLA B*5701: HLA-B 5701 W/RFLX HLA-B HIGH: NEGATIVE

## 2013-10-22 DIAGNOSIS — F1910 Other psychoactive substance abuse, uncomplicated: Secondary | ICD-10-CM | POA: Insufficient documentation

## 2013-10-22 DIAGNOSIS — E785 Hyperlipidemia, unspecified: Secondary | ICD-10-CM | POA: Insufficient documentation

## 2013-10-22 DIAGNOSIS — S0291XA Unspecified fracture of skull, initial encounter for closed fracture: Secondary | ICD-10-CM | POA: Insufficient documentation

## 2013-10-23 ENCOUNTER — Ambulatory Visit (INDEPENDENT_AMBULATORY_CARE_PROVIDER_SITE_OTHER): Payer: Medicaid Other | Admitting: Internal Medicine

## 2013-10-23 ENCOUNTER — Encounter: Payer: Self-pay | Admitting: Internal Medicine

## 2013-10-23 ENCOUNTER — Telehealth: Payer: Self-pay | Admitting: *Deleted

## 2013-10-23 VITALS — BP 128/84 | HR 111 | Temp 97.6°F | Ht 62.0 in | Wt 201.2 lb

## 2013-10-23 DIAGNOSIS — S0291XA Unspecified fracture of skull, initial encounter for closed fracture: Secondary | ICD-10-CM

## 2013-10-23 DIAGNOSIS — S0280XA Fracture of other specified skull and facial bones, unspecified side, initial encounter for closed fracture: Secondary | ICD-10-CM

## 2013-10-23 DIAGNOSIS — E785 Hyperlipidemia, unspecified: Secondary | ICD-10-CM

## 2013-10-23 DIAGNOSIS — F1910 Other psychoactive substance abuse, uncomplicated: Secondary | ICD-10-CM

## 2013-10-23 DIAGNOSIS — B20 Human immunodeficiency virus [HIV] disease: Secondary | ICD-10-CM

## 2013-10-23 MED ORDER — ABACAVIR-DOLUTEGRAVIR-LAMIVUD 600-50-300 MG PO TABS: 1.0000 | ORAL_TABLET | Freq: Every day | ORAL | Status: DC

## 2013-10-23 NOTE — Progress Notes (Signed)
Patient ID: Karen Mcguire, female   DOB: Oct 04, 1965, 48 y.o.   MRN: [id number] @LOGODEPT         Patient Active Problem List   Diagnosis Date Noted  . HIV INFECTION 01/29/2009    Priority: High  . Polysubstance abuse 10/22/2013  . Skull fracture 10/22/2013  . Dyslipidemia 10/22/2013  . HEPATITIS B 01/29/2009  . SCHIZOAFFECTIVE DISORDER 01/29/2009  . Borderline personality disorder 01/29/2009  . MILD MENTAL RETARDATION 01/29/2009  . ALLERGIC RHINITIS, SEASONAL 01/29/2009    Patient's Medications  New Prescriptions   ABACAVIR-DOLUTEGRAVIR-LAMIVUD 600-50-300 MG TABS    Take 1 tablet by mouth daily.  Previous Medications   BENZTROPINE (COGENTIN) 1 MG TABLET    Take 1 mg by mouth every 6 (six) hours as needed for tremors.    CHOLECALCIFEROL (VITAMIN D3) 2000 UNITS TABS    Take 2,000 Units by mouth daily.   DIVALPROEX (DEPAKOTE) 250 MG DR TABLET    Take 250 mg by mouth daily.   DIVALPROEX (DEPAKOTE) 500 MG DR TABLET    Take 1,000 mg by mouth at bedtime. Taking two and one half tablets at night per FL-2 /tkk   DOCUSATE SODIUM (COLACE) 100 MG CAPSULE    Take 100 mg by mouth 3 (three) times daily.   HALOPERIDOL (HALDOL) 10 MG TABLET    Take 10 mg by mouth daily.   HALOPERIDOL DECANOATE (HALDOL DECANOATE) 50 MG/ML INJECTION    Inject 150 mg into the muscle every 28 (twenty-eight) days.   HYDROXYZINE (VISTARIL) 25 MG CAPSULE    Take 25 mg by mouth at bedtime.    LITHIUM CARBONATE 300 MG CAPSULE    Take 300 mg by mouth 2 (two) times daily.   LORAZEPAM (ATIVAN) 2 MG TABLET    Take 1 mg by mouth 3 (three) times daily.   MEDROXYPROGESTERONE (DEPO-PROVERA) 150 MG/ML INJECTION    Inject 150 mg into the muscle every 3 (three) months.   OLANZAPINE (ZYPREXA) 20 MG TABLET    Take 20 mg by mouth at bedtime.   TRIHEXYPHENIDYL (ARTANE) 2 MG TABLET    Take 2 mg by mouth 3 (three) times daily with meals.  Modified Medications   No medications on file  Discontinued Medications   ABACAVIR (ZIAGEN) 300 MG  TABLET    Take 600 mg by mouth daily.    DOLUTEGRAVIR (TIVICAY) 50 MG TABLET    Take 50 mg by mouth daily.   LAMIVUDINE (EPIVIR) 300 MG TABLET    Take 300 mg by mouth daily.    Subjective: Karen Mcguire is in for her first visit with her care provider, Karen Mcguire, who is from her group home. She was diagnosed with HIV infection in 2005 and has been under care at [facility]. She is currently taking Epivir, Ziagen and Tivicay. She is followed by her primary care physician Dr. Lorriane Mcguire and her mental health provider, Dr. Tonye Mcguire.  Review of Systems: A comprehensive review of systems was negative.  Past Medical History  Diagnosis Date  . Schizoaffective disorder   . MR (mental retardation), moderate   . Bipolar disorder   . Hepatitis B carrier   . Hepatitis C without mention of hepatic coma   . HIV infection   . History of traumatic head injury     History  Substance Use Topics  . Smoking status: Current Every Day Smoker -- 0.30 packs/day for 2 years    Types: Cigarettes  . Smokeless tobacco: Former Systems developer  . Alcohol Use: Yes  Comment: beer occ    No family history on file.  No Known Allergies  Objective: Temp: 97.6 F (36.4 C) (04/02 0915) Temp src: Oral (04/02 0915) BP: 128/84 mmHg (04/02 0915) Pulse Rate: 111 (04/02 0915) Body mass index is 36.8 kg/(m^2).  General: She is alert and in no distress Oral: No oropharyngeal lesions her teeth in good condition Skin: No rash Lungs: Clear Cor: Regular S1 and S2 with no murmurs Abdomen:  Soft and nontender Joints and extremities: No acute abnormalities Neuro: Normal speech and conversation  Lab Results Lab Results  Component Value Date   WBC 7.8 10/09/2013   HGB 13.7 10/09/2013   HCT 40.0 10/09/2013   MCV 94.3 10/09/2013   PLT 249 10/09/2013    Lab Results  Component Value Date   CREATININE 0.81 10/09/2013   BUN 9 10/09/2013   NA 138 10/09/2013   K 4.5 10/09/2013   CL 105 10/09/2013   CO2 24 10/09/2013    Lab Results   Component Value Date   ALT 9 10/09/2013   AST 13 10/09/2013   ALKPHOS 51 10/09/2013   BILITOT 0.3 10/09/2013    Lab Results  Component Value Date   CHOL 117 10/09/2013   HDL 42 10/09/2013   LDLCALC 27 10/09/2013   TRIG 242* 10/09/2013   CHOLHDL 2.8 10/09/2013    Lab Results HIV 1 RNA Quant (copies/mL)  Date Value  10/09/2013 30*  01/29/2009 <48 copies/mL      CD4 T Cell Abs (/uL)  Date Value  10/09/2013 1130   01/29/2009 990      Assessment: HIV infection is under very good control. Now have the ability to consolidate her 3 drug regimen into a one pill regimen called Triumeq.  Plan: 1. Simplify antiretroviral regimen to Triumeq 1 daily 2. Followup after repeat CD4 count and HIV viral load in 6 months   Michel Bickers, MD [facility] for [place] [phone] pager   [phone] cell 10/23/2013, 9:38 AM

## 2013-10-23 NOTE — Telephone Encounter (Signed)
Received call from pharmacy. Triumeq must be ordered, will not be available until Monday. They will package all of her medications and send it out Monday afternoon for the patient to start Tuesday.  Spoke with RN at SNF.  Patient will continue on 3 ARV through Monday, will start Triumeq Tuesday morning. Landis Gandy, RN

## 2013-10-28 ENCOUNTER — Ambulatory Visit: Payer: Self-pay | Admitting: Internal Medicine

## 2013-11-04 ENCOUNTER — Telehealth: Payer: Self-pay | Admitting: *Deleted

## 2013-11-04 DIAGNOSIS — B20 Human immunodeficiency virus [HIV] disease: Secondary | ICD-10-CM

## 2013-11-04 MED ORDER — ABACAVIR-DOLUTEGRAVIR-LAMIVUD 600-50-300 MG PO TABS: 1.0000 | ORAL_TABLET | Freq: Every day | ORAL | Status: DC

## 2013-11-04 NOTE — Telephone Encounter (Signed)
Pt's insurance requiring [employer].

## 2014-04-14 ENCOUNTER — Other Ambulatory Visit: Payer: Self-pay

## 2014-04-28 ENCOUNTER — Ambulatory Visit: Payer: Self-pay | Admitting: Internal Medicine

## 2014-04-29 ENCOUNTER — Telehealth: Payer: Self-pay | Admitting: *Deleted

## 2014-04-29 NOTE — Telephone Encounter (Signed)
Karen Mcguire with [facility] called stating there is a glitch in patient's Medicaid and it is not showing active. She has run out of the Triumeq yesterday and will come in tomorrow to meet with patient assistance to see about getting an emergency supply. Sharyn Lull will also try to do [employer] so she can have her labs done. She missed her appt on 04/14/14.

## 2014-04-30 ENCOUNTER — Other Ambulatory Visit: Payer: Medicaid Other

## 2014-04-30 ENCOUNTER — Ambulatory Visit: Payer: Self-pay

## 2014-04-30 DIAGNOSIS — B20 Human immunodeficiency virus [HIV] disease: Secondary | ICD-10-CM

## 2014-04-30 NOTE — Telephone Encounter (Signed)
Patient insurance is now active and she will be able to refill the Triumeq and she did come in for lab work.

## 2014-05-01 LAB — T-HELPER CELL (CD4) - (RCID CLINIC ONLY)
CD4 T CELL ABS: 1450 /uL (ref 400–2700)
CD4 T CELL HELPER: 46 % (ref 33–55)

## 2014-05-01 LAB — HIV-1 RNA QUANT-NO REFLEX-BLD
HIV 1 RNA Quant: 44 copies/mL — ABNORMAL HIGH (ref <20)
HIV-1 RNA QUANT, LOG: 1.64 {Log} — AB (ref <1.30)

## 2014-05-12 ENCOUNTER — Ambulatory Visit: Payer: Self-pay | Admitting: Internal Medicine

## 2014-05-25 ENCOUNTER — Encounter: Payer: Self-pay | Admitting: Internal Medicine

## 2014-05-27 ENCOUNTER — Ambulatory Visit (INDEPENDENT_AMBULATORY_CARE_PROVIDER_SITE_OTHER): Payer: Medicaid Other | Admitting: Internal Medicine

## 2014-05-27 DIAGNOSIS — B20 Human immunodeficiency virus [HIV] disease: Secondary | ICD-10-CM

## 2014-05-27 NOTE — Progress Notes (Signed)
Patient ID: Karen Mcguire, female   DOB: 03/21/1966, 48 y.o.   MRN: [id number]          Patient Active Problem List   Diagnosis Date Noted  . Human immunodeficiency virus (HIV) disease 01/29/2009    Priority: High  . Polysubstance abuse 10/22/2013  . Skull fracture 10/22/2013  . Dyslipidemia 10/22/2013  . HEPATITIS B 01/29/2009  . SCHIZOAFFECTIVE DISORDER 01/29/2009  . Borderline personality disorder 01/29/2009  . MILD MENTAL RETARDATION 01/29/2009  . ALLERGIC RHINITIS, SEASONAL 01/29/2009    Patient's Medications  New Prescriptions   No medications on file  Previous Medications   ABACAVIR-DOLUTEGRAVIR-LAMIVUD 600-50-300 MG TABS    Take 1 tablet by mouth daily.   BENZTROPINE (COGENTIN) 1 MG TABLET    Take 1 mg by mouth every 6 (six) hours as needed for tremors.    CHOLECALCIFEROL (VITAMIN D3) 2000 UNITS TABS    Take 2,000 Units by mouth daily.   DIVALPROEX (DEPAKOTE) 250 MG DR TABLET    Take 250 mg by mouth daily.   DIVALPROEX (DEPAKOTE) 500 MG DR TABLET    Take 1,000 mg by mouth at bedtime. Taking two and one half tablets at night per FL-2 /tkk   DOCUSATE SODIUM (COLACE) 100 MG CAPSULE    Take 100 mg by mouth 3 (three) times daily.   HALOPERIDOL (HALDOL) 10 MG TABLET    Take 10 mg by mouth daily.   HALOPERIDOL DECANOATE (HALDOL DECANOATE) 50 MG/ML INJECTION    Inject 150 mg into the muscle every 28 (twenty-eight) days.   HYDROXYZINE (VISTARIL) 25 MG CAPSULE    Take 25 mg by mouth at bedtime.    LITHIUM CARBONATE 300 MG CAPSULE    Take 300 mg by mouth 2 (two) times daily.   LORAZEPAM (ATIVAN) 2 MG TABLET    Take 1 mg by mouth 3 (three) times daily.   MEDROXYPROGESTERONE (DEPO-PROVERA) 150 MG/ML INJECTION    Inject 150 mg into the muscle every 3 (three) months.   OLANZAPINE (ZYPREXA) 20 MG TABLET    Take 20 mg by mouth at bedtime.   TRIHEXYPHENIDYL (ARTANE) 2 MG TABLET    Take 2 mg by mouth 3 (three) times daily with meals.  Modified Medications   No medications on file    Discontinued Medications   No medications on file    Subjective: Karen Mcguire is in for her routine visit. Her care provider from her group home is with her. Karen Mcguire states that she takes her medications every day. She does not report having any problems tolerating her Triumeq but I'm not sure she knows which medication that is. She is requesting condoms. When I ask her if she has been sexually active she initially said no but then her care provider told me that she ran away from the group home recently. Karen Mcguire says that while she was away she had sex with a female partner who did not use a condom. She states that she did disclose her HIV status to him. She is requesting a Pap smear and breast exam. Review of Systems: Pertinent items are noted in HPI.  Past Medical History  Diagnosis Date  . Schizoaffective disorder   . MR (mental retardation), moderate   . Bipolar disorder   . Hepatitis B carrier   . Hepatitis C without mention of hepatic coma   . HIV infection   . History of traumatic head injury     History  Substance Use Topics  . Smoking status: Current Every Day  Smoker -- 0.30 packs/day for 2 years    Types: Cigarettes  . Smokeless tobacco: Former Systems developer  . Alcohol Use: Yes     Comment: beer occ    No family history on file.  No Known Allergies  Objective: Temp: 98.3 F (36.8 C) (11/04 1454) Temp Source: Oral (11/04 1454) BP: 105/65 mmHg (11/04 1454) Pulse Rate: 96 (11/04 1454) Body mass index is 39.5 kg/(m^2).  General: she is talkative and in good spirits Skin: no rash Lungs: clear Cor: regular S1 and S2 with no murmurs Breasts: Small superficial papule that appears to be a follicle-based near her right nipple at the 9:00 position Abdomen: obese but soft and nontender  Lab Results Lab Results  Component Value Date   WBC 7.8 10/09/2013   HGB 13.7 10/09/2013   HCT 40.0 10/09/2013   MCV 94.3 10/09/2013   PLT 249 10/09/2013    Lab Results  Component Value Date    CREATININE 0.81 10/09/2013   BUN 9 10/09/2013   NA 138 10/09/2013   K 4.5 10/09/2013   CL 105 10/09/2013   CO2 24 10/09/2013    Lab Results  Component Value Date   ALT 9 10/09/2013   AST 13 10/09/2013   ALKPHOS 51 10/09/2013   BILITOT 0.3 10/09/2013    Lab Results  Component Value Date   CHOL 117 10/09/2013   HDL 42 10/09/2013   LDLCALC 27 10/09/2013   TRIG 242* 10/09/2013   CHOLHDL 2.8 10/09/2013    Lab Results HIV 1 RNA QUANT (copies/mL)  Date Value  04/30/2014 44*  10/09/2013 30*  01/29/2009 <48 copies/mL   CD4 T CELL ABS  Date Value  04/30/2014 1450 /uL  10/09/2013 1130 /uL  01/29/2009 990 cmm     Assessment: Her HIV infection remains under good control. I will continue Triumeq. I talked to her about the risk of unprotected sex and specifically that she could become infected with multidrug-resistant strains of HIV and other sexually transmitted pathogens.  She has been given a supply of condoms  Plan: 1. Continue Triumeq 2. Follow-up here after lab work in [place] months   Michel Bickers, MD [facility] for [place] [phone] pager   [phone] cell 05/27/2014, 3:14 PM

## 2014-09-09 ENCOUNTER — Other Ambulatory Visit (HOSPITAL_COMMUNITY): Payer: Self-pay | Admitting: Family Medicine

## 2014-09-09 ENCOUNTER — Ambulatory Visit (HOSPITAL_COMMUNITY)
Admission: RE | Admit: 2014-09-09 | Discharge: 2014-09-09 | Disposition: A | Discharge status: Home / Self Care | Payer: Medicaid Other | Source: Ambulatory Visit | Attending: Family Medicine | Admitting: Family Medicine

## 2014-09-09 DIAGNOSIS — M25552 Pain in left hip: Secondary | ICD-10-CM | POA: Insufficient documentation

## 2014-09-09 DIAGNOSIS — M1612 Unilateral primary osteoarthritis, left hip: Secondary | ICD-10-CM

## 2014-09-09 DIAGNOSIS — R937 Abnormal findings on diagnostic imaging of other parts of musculoskeletal system: Secondary | ICD-10-CM | POA: Diagnosis not present

## 2014-10-01 ENCOUNTER — Encounter: Payer: Self-pay | Admitting: Orthopedic Surgery

## 2014-10-01 ENCOUNTER — Ambulatory Visit (INDEPENDENT_AMBULATORY_CARE_PROVIDER_SITE_OTHER): Payer: Medicaid Other | Admitting: Orthopedic Surgery

## 2014-10-01 ENCOUNTER — Telehealth: Payer: Self-pay | Admitting: *Deleted

## 2014-10-01 VITALS — BP 112/77 | Ht 62.0 in | Wt 213.0 lb

## 2014-10-01 DIAGNOSIS — M87059 Idiopathic aseptic necrosis of unspecified femur: Secondary | ICD-10-CM

## 2014-10-01 NOTE — Patient Instructions (Signed)
Will refer to [facility] orthopedics

## 2014-10-01 NOTE — Telephone Encounter (Signed)
NOTE FAXED TO DR DONDIEGO ADVISING RECOMMENDATION FOR REFERRAL TO [facility] AND PATIENT'S INSURANCE REQUIRES REFERRAL FROM PCP
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
## 2014-11-16 ENCOUNTER — Other Ambulatory Visit: Payer: Self-pay | Admitting: *Deleted

## 2014-11-16 DIAGNOSIS — B20 Human immunodeficiency virus [HIV] disease: Secondary | ICD-10-CM

## 2014-11-16 MED ORDER — ABACAVIR-DOLUTEGRAVIR-LAMIVUD 600-50-300 MG PO TABS: 1.0000 | ORAL_TABLET | Freq: Every day | ORAL | Status: DC

## 2014-12-17 ENCOUNTER — Ambulatory Visit (INDEPENDENT_AMBULATORY_CARE_PROVIDER_SITE_OTHER): Payer: Medicaid Other | Admitting: Internal Medicine

## 2014-12-17 ENCOUNTER — Encounter: Payer: Self-pay | Admitting: Internal Medicine

## 2014-12-17 DIAGNOSIS — B20 Human immunodeficiency virus [HIV] disease: Secondary | ICD-10-CM | POA: Diagnosis not present

## 2014-12-17 LAB — LIPID PANEL
CHOLESTEROL: 95 mg/dL (ref 0–200)
HDL: 23 mg/dL — ABNORMAL LOW (ref >=46)
LDL Cholesterol: 17 mg/dL (ref 0–99)
Total CHOL/HDL Ratio: 4.1 Ratio
Triglycerides: 276 mg/dL — ABNORMAL HIGH (ref <150)
VLDL: 55 mg/dL — AB (ref 0–40)

## 2014-12-17 LAB — COMPREHENSIVE METABOLIC PANEL
ALBUMIN: 4.4 g/dL (ref 3.5–5.2)
ALK PHOS: 39 U/L (ref 39–117)
ALT: 15 U/L (ref 0–35)
AST: 18 U/L (ref 0–37)
BUN: 8 mg/dL (ref 6–23)
CO2: 20 meq/L (ref 19–32)
Calcium: 9.6 mg/dL (ref 8.4–10.5)
Chloride: 109 mEq/L (ref 96–112)
Creat: 0.85 mg/dL (ref 0.50–1.10)
Glucose, Bld: 118 mg/dL — ABNORMAL HIGH (ref 70–99)
POTASSIUM: 4.3 meq/L (ref 3.5–5.3)
SODIUM: 139 meq/L (ref 135–145)
Total Bilirubin: 0.4 mg/dL (ref 0.2–1.2)
Total Protein: 7.4 g/dL (ref 6.0–8.3)

## 2014-12-17 LAB — CBC
HCT: 41.4 % (ref 36.0–46.0)
Hemoglobin: 14 g/dL (ref 12.0–15.0)
MCH: 33.2 pg (ref 26.0–34.0)
MCHC: 33.8 g/dL (ref 30.0–36.0)
MCV: 98.1 fL (ref 78.0–100.0)
MPV: 9.2 fL (ref 8.6–12.4)
Platelets: 242 10*3/uL (ref 150–400)
RBC: 4.22 MIL/uL (ref 3.87–5.11)
RDW: 14.7 % (ref 11.5–15.5)
WBC: 8.9 10*3/uL (ref 4.0–10.5)

## 2014-12-17 NOTE — Progress Notes (Signed)
Patient ID: Karen Mcguire, female   DOB: [birth year], 49 y.o.   MRN: [id number]          Patient Active Problem List   Diagnosis Date Noted  . Human immunodeficiency virus (HIV) disease 01/29/2009    Priority: High  . Polysubstance abuse 10/22/2013  . Skull fracture 10/22/2013  . Dyslipidemia 10/22/2013  . HEPATITIS B 01/29/2009  . SCHIZOAFFECTIVE DISORDER 01/29/2009  . Borderline personality disorder 01/29/2009  . MILD MENTAL RETARDATION 01/29/2009  . ALLERGIC RHINITIS, SEASONAL 01/29/2009    Patient's Medications  New Prescriptions   No medications on file  Previous Medications   ABACAVIR-DOLUTEGRAVIR-LAMIVUD 600-50-300 MG TABS    Take 1 tablet by mouth daily.   BENZTROPINE (COGENTIN) 1 MG TABLET    Take 1 mg by mouth every 6 (six) hours as needed for tremors.    CHOLECALCIFEROL (VITAMIN D3) 2000 UNITS TABS    Take 2,000 Units by mouth daily.   DIVALPROEX (DEPAKOTE) 250 MG DR TABLET    Take 250 mg by mouth daily.   DIVALPROEX (DEPAKOTE) 500 MG DR TABLET    Take 1,000 mg by mouth at bedtime. Taking two and one half tablets at night per FL-2 /tkk   DOCUSATE SODIUM (COLACE) 100 MG CAPSULE    Take 100 mg by mouth 3 (three) times daily.   HALOPERIDOL (HALDOL) 10 MG TABLET    Take 10 mg by mouth daily.   HALOPERIDOL DECANOATE (HALDOL DECANOATE) 50 MG/ML INJECTION    Inject 150 mg into the muscle every 28 (twenty-eight) days.   HYDROXYZINE (VISTARIL) 25 MG CAPSULE    Take 25 mg by mouth at bedtime.    LITHIUM CARBONATE 300 MG CAPSULE    Take 300 mg by mouth 2 (two) times daily.   LORAZEPAM (ATIVAN) 2 MG TABLET    Take 1 mg by mouth 3 (three) times daily.   MEDROXYPROGESTERONE (DEPO-PROVERA) 150 MG/ML INJECTION    Inject 150 mg into the muscle every 3 (three) months.   OLANZAPINE (ZYPREXA) 20 MG TABLET    Take 20 mg by mouth at bedtime.   TRIHEXYPHENIDYL (ARTANE) 2 MG TABLET    Take 2 mg by mouth 3 (three) times daily with meals.  Modified Medications   No medications on file    Discontinued Medications   No medications on file    Subjective: Karen Mcguire is in with her caregiver from the group home for her routine HIV follow-up visit. Karen Mcguire does not know the name of her HIV medication and has difficulty picking Triumeq off of the medication chart but her caregiver confirms that she is taking it every day. She has been at the facility continuously since her last visit here. She has not left for any period of time and has not been sexually active. She is feeling well. Review of Systems: Pertinent items are noted in HPI.  Past Medical History  Diagnosis Date  . Schizoaffective disorder   . MR (mental retardation), moderate   . Bipolar disorder   . Hepatitis B carrier   . Hepatitis C without mention of hepatic coma   . HIV infection   . History of traumatic head injury     History  Substance Use Topics  . Smoking status: Current Every Day Smoker -- 0.30 packs/day for 2 years    Types: Cigarettes  . Smokeless tobacco: Former Systems developer     Comment: 8 cigs per day  . Alcohol Use: 0.0 oz/week    0 Standard drinks or equivalent per  week    No family history on file.  No Known Allergies  Objective: Temp: 98.2 F (36.8 C) (05/26 0953) Temp Source: Oral (05/26 0953) BP: 114/78 mmHg (05/26 0953) Pulse Rate: 86 (05/26 0953) Body mass index is 38.99 kg/(m^2).  General: Her weight is stable at 213 pounds Oral: No oropharyngeal lesions Skin: No rash Lungs: Clear Cor: Regular S1 and S2 with no murmurs Mood: Normal she does not appear anxious or depressed. She is smiling and in good spirits  Lab Results Lab Results  Component Value Date   WBC 7.8 10/09/2013   HGB 13.7 10/09/2013   HCT 40.0 10/09/2013   MCV 94.3 10/09/2013   PLT 249 10/09/2013    Lab Results  Component Value Date   CREATININE 0.81 10/09/2013   BUN 9 10/09/2013   NA 138 10/09/2013   K 4.5 10/09/2013   CL 105 10/09/2013   CO2 24 10/09/2013    Lab Results  Component Value Date   ALT 9  10/09/2013   AST 13 10/09/2013   ALKPHOS 51 10/09/2013   BILITOT 0.3 10/09/2013    Lab Results  Component Value Date   CHOL 117 10/09/2013   HDL 42 10/09/2013   LDLCALC 27 10/09/2013   TRIG 242* 10/09/2013   CHOLHDL 2.8 10/09/2013    Lab Results HIV 1 RNA QUANT (copies/mL)  Date Value  04/30/2014 44*  10/09/2013 30*  01/29/2009 <48 copies/mL   CD4 T CELL ABS  Date Value  04/30/2014 1450 /uL  10/09/2013 1130 /uL  01/29/2009 990 cmm     Assessment: I will repeat blood work today and continue Triumeq.  Plan: 1. Continue Triumeq 2. Repeat blood work today 3. Follow-up in 6 months   Michel Bickers, MD [facility] for [place] [phone] pager   [phone] cell 12/17/2014, 10:12 AM

## 2014-12-18 ENCOUNTER — Other Ambulatory Visit: Payer: Self-pay | Admitting: Licensed Clinical Social Worker

## 2014-12-18 DIAGNOSIS — B20 Human immunodeficiency virus [HIV] disease: Secondary | ICD-10-CM

## 2014-12-18 LAB — T-HELPER CELL (CD4) - (RCID CLINIC ONLY)
CD4 % Helper T Cell: 43 % (ref 33–55)
CD4 T Cell Abs: 1380 /uL (ref 400–2700)

## 2014-12-18 LAB — RPR

## 2014-12-18 MED ORDER — ABACAVIR-DOLUTEGRAVIR-LAMIVUD 600-50-300 MG PO TABS
1.0000 | ORAL_TABLET | Freq: Every day | ORAL | Status: DC
Start: 2014-12-18 — End: 2015-11-30

## 2014-12-20 LAB — HIV-1 RNA QUANT-NO REFLEX-BLD
HIV 1 RNA Quant: 56 copies/mL — ABNORMAL HIGH (ref <20)
HIV-1 RNA QUANT, LOG: 1.75 {Log} — AB (ref <1.30)

## 2015-01-01 ENCOUNTER — Ambulatory Visit (INDEPENDENT_AMBULATORY_CARE_PROVIDER_SITE_OTHER): Payer: Medicaid Other | Admitting: *Deleted

## 2015-01-01 ENCOUNTER — Other Ambulatory Visit (HOSPITAL_COMMUNITY)
Admission: RE | Admit: 2015-01-01 | Discharge: 2015-01-01 | Disposition: A | Discharge status: Home / Self Care | Payer: Medicaid Other | Source: Ambulatory Visit | Attending: Internal Medicine | Admitting: Internal Medicine

## 2015-01-01 DIAGNOSIS — Z1231 Encounter for screening mammogram for malignant neoplasm of breast: Secondary | ICD-10-CM | POA: Diagnosis not present

## 2015-01-01 DIAGNOSIS — Z113 Encounter for screening for infections with a predominantly sexual mode of transmission: Secondary | ICD-10-CM | POA: Insufficient documentation

## 2015-01-01 DIAGNOSIS — Z124 Encounter for screening for malignant neoplasm of cervix: Secondary | ICD-10-CM

## 2015-01-01 DIAGNOSIS — Z01419 Encounter for gynecological examination (general) (routine) without abnormal findings: Secondary | ICD-10-CM | POA: Diagnosis not present

## 2015-01-01 NOTE — Patient Instructions (Signed)
Your results will be ready in about a week.  I will mail them to you.  Thank you for coming to the Center for your care.  Bexleigh Theriault,  RN 

## 2015-01-01 NOTE — Progress Notes (Signed)
  Subjective:     Karen Mcguire is a 49 y.o. woman who comes in today for a  pap smear only.  Previous abnormal Pap smears: no. Contraception: currently living in a [facility], menopausal   Objective:  Creamy, yellow-white vaginal discharge seen in vaginal vault.  No itching verbalized by the pt.  Pelvic Exam:  Pap smear obtained.   Assessment:    Screening pap smear.   Plan:    Follow up in one year, or as indicated by Pap results.  Pt given educational materials re: HIV and women, self-esteem, BSE, nutrition and diet management, PAP smears and partner safety.

## 2015-01-05 LAB — CYTOLOGY - PAP

## 2015-01-06 ENCOUNTER — Encounter: Payer: Self-pay | Admitting: *Deleted

## 2015-11-30 ENCOUNTER — Other Ambulatory Visit: Payer: Self-pay | Admitting: *Deleted

## 2015-11-30 DIAGNOSIS — B20 Human immunodeficiency virus [HIV] disease: Secondary | ICD-10-CM

## 2015-11-30 MED ORDER — ABACAVIR-DOLUTEGRAVIR-LAMIVUD 600-50-300 MG PO TABS: 1.0000 | ORAL_TABLET | Freq: Every day | ORAL | Status: DC

## 2016-03-14 ENCOUNTER — Other Ambulatory Visit: Payer: Medicaid Other

## 2016-03-14 DIAGNOSIS — Z79899 Other long term (current) drug therapy: Secondary | ICD-10-CM

## 2016-03-14 DIAGNOSIS — B20 Human immunodeficiency virus [HIV] disease: Secondary | ICD-10-CM

## 2016-03-14 DIAGNOSIS — Z113 Encounter for screening for infections with a predominantly sexual mode of transmission: Secondary | ICD-10-CM

## 2016-03-14 LAB — COMPREHENSIVE METABOLIC PANEL
ALK PHOS: 49 U/L (ref 33–115)
ALT: 11 U/L (ref 6–29)
AST: 13 U/L (ref 10–35)
Albumin: 3.7 g/dL (ref 3.6–5.1)
BILIRUBIN TOTAL: 0.2 mg/dL (ref 0.2–1.2)
BUN: 9 mg/dL (ref 7–25)
CO2: 20 mmol/L (ref 20–31)
CREATININE: 0.79 mg/dL (ref 0.50–1.10)
Calcium: 8.9 mg/dL (ref 8.6–10.2)
Chloride: 109 mmol/L (ref 98–110)
GLUCOSE: 104 mg/dL — AB (ref 65–99)
Potassium: 4.4 mmol/L (ref 3.5–5.3)
SODIUM: 137 mmol/L (ref 135–146)
Total Protein: 6.7 g/dL (ref 6.1–8.1)

## 2016-03-14 LAB — CBC WITH DIFFERENTIAL/PLATELET
BASOS PCT: 0 %
Basophils Absolute: 0 cells/uL (ref 0–200)
EOS PCT: 1 %
Eosinophils Absolute: 106 cells/uL (ref 15–500)
HEMATOCRIT: 40.5 % (ref 35.0–45.0)
Hemoglobin: 13.7 g/dL (ref 11.7–15.5)
LYMPHS PCT: 15 %
Lymphs Abs: 1590 cells/uL (ref 850–3900)
MCH: 33.1 pg — ABNORMAL HIGH (ref 27.0–33.0)
MCHC: 33.8 g/dL (ref 32.0–36.0)
MCV: 97.8 fL (ref 80.0–100.0)
MONO ABS: 954 {cells}/uL — AB (ref 200–950)
MONOS PCT: 9 %
MPV: 9.7 fL (ref 7.5–12.5)
NEUTROS PCT: 75 %
Neutro Abs: 7950 cells/uL — ABNORMAL HIGH (ref 1500–7800)
PLATELETS: 240 10*3/uL (ref 140–400)
RBC: 4.14 MIL/uL (ref 3.80–5.10)
RDW: 13.8 % (ref 11.0–15.0)
WBC: 10.6 10*3/uL (ref 3.8–10.8)

## 2016-03-14 LAB — LIPID PANEL
Cholesterol: 81 mg/dL — ABNORMAL LOW (ref 125–200)
HDL: 26 mg/dL — AB (ref >=46)
LDL CALC: 21 mg/dL (ref <130)
Total CHOL/HDL Ratio: 3.1 Ratio (ref <=5.0)
Triglycerides: 169 mg/dL — ABNORMAL HIGH (ref <150)
VLDL: 34 mg/dL — ABNORMAL HIGH (ref <30)

## 2016-03-15 LAB — T-HELPER CELL (CD4) - (RCID CLINIC ONLY)
CD4 T CELL ABS: 840 /uL (ref 400–2700)
CD4 T CELL HELPER: 49 % (ref 33–55)

## 2016-03-15 LAB — RPR

## 2016-03-16 LAB — HIV-1 RNA QUANT-NO REFLEX-BLD
HIV 1 RNA QUANT: 37 {copies}/mL — AB (ref <20)
HIV-1 RNA QUANT, LOG: 1.57 {Log_copies}/mL — AB (ref <1.30)

## 2016-03-28 ENCOUNTER — Ambulatory Visit: Payer: Self-pay | Admitting: Internal Medicine

## 2016-03-31 ENCOUNTER — Other Ambulatory Visit (HOSPITAL_COMMUNITY)
Admission: RE | Admit: 2016-03-31 | Discharge: 2016-03-31 | Disposition: A | Discharge status: Home / Self Care | Payer: Medicaid Other | Source: Ambulatory Visit | Attending: Internal Medicine | Admitting: Internal Medicine

## 2016-03-31 ENCOUNTER — Ambulatory Visit (INDEPENDENT_AMBULATORY_CARE_PROVIDER_SITE_OTHER): Payer: Medicaid Other | Admitting: *Deleted

## 2016-03-31 DIAGNOSIS — Z113 Encounter for screening for infections with a predominantly sexual mode of transmission: Secondary | ICD-10-CM

## 2016-03-31 DIAGNOSIS — Z23 Encounter for immunization: Secondary | ICD-10-CM | POA: Diagnosis not present

## 2016-03-31 DIAGNOSIS — Z01419 Encounter for gynecological examination (general) (routine) without abnormal findings: Secondary | ICD-10-CM | POA: Insufficient documentation

## 2016-03-31 DIAGNOSIS — Z124 Encounter for screening for malignant neoplasm of cervix: Secondary | ICD-10-CM

## 2016-03-31 NOTE — Patient Instructions (Signed)
Your results will be ready in about a week.  I will mail them to you.  Thank you for coming to the Center for your care.  Aleathea Pugmire,  RN 

## 2016-03-31 NOTE — Progress Notes (Signed)
Subjective:     Karen Mcguire is a 50 y.o. woman who comes in today for a  pap smear only. Previous abnormal Pap smears: .no. Contraception: condoms  Objective:    There were no vitals taken for this visit. Pelvic Exam:  Pap smear obtained.   Assessment:    Screening pap smear.   Plan:     Follow up in one year, or as indicated by Pap results.  Pt given educational materials re: HIV and women, self-esteem, BSE, nutrition and diet management, PAP smears and partner safety. Pt given condoms

## 2016-04-03 LAB — CYTOLOGY - PAP

## 2016-04-03 LAB — CERVICOVAGINAL ANCILLARY ONLY
CHLAMYDIA, DNA PROBE: NEGATIVE
NEISSERIA GONORRHEA: NEGATIVE

## 2016-04-14 ENCOUNTER — Encounter: Payer: Self-pay | Admitting: *Deleted

## 2016-04-20 ENCOUNTER — Ambulatory Visit: Payer: Self-pay | Admitting: Internal Medicine

## 2016-05-16 ENCOUNTER — Ambulatory Visit (INDEPENDENT_AMBULATORY_CARE_PROVIDER_SITE_OTHER): Payer: Medicaid Other | Admitting: Internal Medicine

## 2016-05-16 ENCOUNTER — Encounter: Payer: Self-pay | Admitting: Internal Medicine

## 2016-05-16 DIAGNOSIS — B20 Human immunodeficiency virus [HIV] disease: Secondary | ICD-10-CM

## 2016-05-16 NOTE — Progress Notes (Signed)
Patient Active Problem List   Diagnosis Date Noted  . Human immunodeficiency virus (HIV) disease ([place]) 01/29/2009    Priority: High  . Polysubstance abuse 10/22/2013  . Skull fracture ([place]) 10/22/2013  . Dyslipidemia 10/22/2013  . SCHIZOAFFECTIVE DISORDER 01/29/2009  . Borderline personality disorder 01/29/2009  . MILD MENTAL RETARDATION 01/29/2009  . ALLERGIC RHINITIS, SEASONAL 01/29/2009    Patient's Medications  New Prescriptions   No medications on file  Previous Medications   ABACAVIR-DOLUTEGRAVIR-LAMIVUDINE (TRIUMEQ) 600-50-300 MG TABLET    Take 1 tablet by mouth daily.   BENZTROPINE (COGENTIN) 1 MG TABLET    Take 1 mg by mouth every 6 (six) hours as needed for tremors.    CHOLECALCIFEROL (VITAMIN D3) 2000 UNITS TABS    Take 2,000 Units by mouth daily.   DIVALPROEX (DEPAKOTE) 250 MG DR TABLET    Take 250 mg by mouth daily.   DIVALPROEX (DEPAKOTE) 500 MG DR TABLET    Take 1,000 mg by mouth at bedtime. Taking two and one half tablets at night per FL-2 /tkk   DOCUSATE SODIUM (COLACE) 100 MG CAPSULE    Take 100 mg by mouth 3 (three) times daily.   HALOPERIDOL (HALDOL) 10 MG TABLET    Take 10 mg by mouth daily.   HALOPERIDOL DECANOATE (HALDOL DECANOATE) 50 MG/ML INJECTION    Inject 150 mg into the muscle every 28 (twenty-eight) days.   HYDROXYZINE (VISTARIL) 25 MG CAPSULE    Take 25 mg by mouth at bedtime.    LITHIUM CARBONATE 300 MG CAPSULE    Take 300 mg by mouth 2 (two) times daily.   MEDROXYPROGESTERONE (DEPO-PROVERA) 150 MG/ML INJECTION    Inject 150 mg into the muscle every 3 (three) months.   NAPROXEN (NAPROSYN) 500 MG TABLET    Take 500 mg by mouth 2 (two) times daily with a meal.   OLANZAPINE (ZYPREXA) 20 MG TABLET    Take 20 mg by mouth at bedtime.   TRIHEXYPHENIDYL (ARTANE) 2 MG TABLET    Take 2 mg by mouth 3 (three) times daily with meals.  Modified Medications   No medications on file  Discontinued Medications   LORAZEPAM (ATIVAN) 2 MG TABLET    Take 1  mg by mouth 3 (three) times daily.    Subjective: Karen Mcguire is in for her first visit since May of last year. She is accompanied by a worker from her group home. She is unable to tell me anything about her medications but records brought with her today show that she has been taking her Triumeq daily without missing doses.   Review of Systems: Review of Systems  Unable to perform ROS: Mental acuity    Past Medical History:  Diagnosis Date  . Bipolar disorder ([place])   . Hepatitis B carrier ([place])   . Hepatitis C without mention of hepatic coma   . History of traumatic head injury   . HIV infection ([place])   . MR (mental retardation), moderate   . Schizoaffective disorder [facility])     Social History  Substance Use Topics  . Smoking status: Current Every Day Smoker    Packs/day: 0.40    Years: 2.00    Types: Cigarettes  . Smokeless tobacco: Former Systems developer     Comment: 7 cigs per day  . Alcohol use No    No family history on file.  Allergies  Allergen Reactions  . Penicillins Swelling    Objective:  Vitals:  05/16/16 1115  BP: 122/88  Pulse: 87  Temp: 98.3 F (36.8 C)  TempSrc: Oral  Weight: 193 lb 8 oz (87.8 kg)   Body mass index is 35.39 kg/m.  Physical Exam  Constitutional: She is oriented to person, place, and time.  She is alert and talkative complaining about various aches and pains.  HENT:  Mouth/Throat: No oropharyngeal exudate.  Cardiovascular: Normal rate and regular rhythm.   No murmur heard. Pulmonary/Chest: Effort normal and breath sounds normal. She has no wheezes. She has no rales.  Abdominal: Soft. There is no tenderness.  Musculoskeletal: Normal range of motion. She exhibits no edema or tenderness.  Neurological: She is alert and oriented to person, place, and time.  Skin: No rash noted.  Psychiatric: Mood and affect normal.    Lab Results Lab Results  Component Value Date   WBC 10.6 03/14/2016   HGB 13.7 03/14/2016   HCT 40.5 03/14/2016    MCV 97.8 03/14/2016   PLT 240 03/14/2016    Lab Results  Component Value Date   CREATININE 0.79 03/14/2016   BUN 9 03/14/2016   NA 137 03/14/2016   K 4.4 03/14/2016   CL 109 03/14/2016   CO2 20 03/14/2016    Lab Results  Component Value Date   ALT 11 03/14/2016   AST 13 03/14/2016   ALKPHOS 49 03/14/2016   BILITOT 0.2 03/14/2016    Lab Results  Component Value Date   CHOL 81 (L) 03/14/2016   HDL 26 (L) 03/14/2016   LDLCALC 21 03/14/2016   TRIG 169 (H) 03/14/2016   CHOLHDL 3.1 03/14/2016   HIV 1 RNA Quant (copies/mL)  Date Value  03/14/2016 37 (H)  12/17/2014 56 (H)  04/30/2014 44 (H)   CD4 T Cell Abs (/uL)  Date Value  03/14/2016 840  12/17/2014 1,380  04/30/2014 1,450     Problem List Items Addressed This Visit      High   Human immunodeficiency virus (HIV) disease ([place])    Her infection remains under good control. I would like her to continue Triumeq and definitely. She needs to follow-up in 12 months.       Other Visit Diagnoses   None.       Michel Bickers, MD [facility] for [facility] [place] [facility] [phone] pager   [phone] cell 05/16/2016, 11:42 AM

## 2016-05-16 NOTE — Assessment & Plan Note (Signed)
Her infection remains under good control. I would like her to continue Triumeq and definitely. She needs to follow-up in 12 months.

## 2016-11-23 ENCOUNTER — Other Ambulatory Visit: Payer: Self-pay | Admitting: Internal Medicine

## 2016-11-23 DIAGNOSIS — B20 Human immunodeficiency virus [HIV] disease: Secondary | ICD-10-CM

## 2017-04-16 ENCOUNTER — Encounter: Payer: Self-pay | Admitting: Infectious Diseases

## 2017-04-16 ENCOUNTER — Other Ambulatory Visit: Payer: Medicaid Other

## 2017-04-16 ENCOUNTER — Other Ambulatory Visit (HOSPITAL_COMMUNITY)
Admission: RE | Admit: 2017-04-16 | Discharge: 2017-04-16 | Disposition: A | Discharge status: Home / Self Care | Payer: Medicaid Other | Source: Ambulatory Visit | Attending: Infectious Diseases | Admitting: Infectious Diseases

## 2017-04-16 ENCOUNTER — Ambulatory Visit (INDEPENDENT_AMBULATORY_CARE_PROVIDER_SITE_OTHER): Payer: Medicaid Other | Admitting: Infectious Diseases

## 2017-04-16 VITALS — BP 124/83 | HR 76 | Temp 98.4°F | Wt 176.0 lb

## 2017-04-16 DIAGNOSIS — Z124 Encounter for screening for malignant neoplasm of cervix: Secondary | ICD-10-CM | POA: Diagnosis not present

## 2017-04-16 DIAGNOSIS — B20 Human immunodeficiency virus [HIV] disease: Secondary | ICD-10-CM

## 2017-04-16 NOTE — Progress Notes (Signed)
     Subjective:    Karen Mcguire is a 51 y.o.  female here for an annual pelvic exam and pap smear.  No LMP recorded. Patient has had an injection.     Current GYN complaints or concerns: none  Past Medical History:  Diagnosis Date  . Bipolar disorder ([place])   . Hepatitis B carrier ([place])   . Hepatitis C without mention of hepatic coma   . History of traumatic head injury   . HIV infection ([place])   . MR (mental retardation), moderate   . Schizoaffective disorder [facility])     Gynecologic History [id number]  No LMP recorded. Patient has had an injection. Contraception: Depo-Provera injections Last Pap: 03/2016. Results were: normal Last Mammogram: never.    Review of Systems Patient denies any abdominal/pelvic pain, problems with bowel movements, urination, vaginal discharge or intercourse.   Objective:  BP 124/83   Pulse 76   Temp 98.4 F (36.9 C) (Oral)   Wt 176 lb (79.8 kg)   BMI 32.19 kg/m  Physical Exam  Constitutional: Well developed, well nourished, no acute distress. She is alert and oriented x3.  Pelvic: External genitalia is normal in appearance. The vagina is normal in appearance. The cervix is bulbous and easily visualized. No CMT, normal expected cervical mucus present. Breast exam deferred.  Psych: She has a normal mood and affect.    Assessment:  Normal pelvic exam. Thin prep pap was obtained and sent for cytology, HPV and GC/C today.   Plan:  Health Maintenance = Return in 1 year for annual pap screening unless indicated sooner.   Contraception planning = on depo-provera injections and without regular menses. Would consider stopping at future visit as she is nearing/experiencing menopause and long-term use a/w decreased bone mineral density.   HIV = F/U as scheduled with Dr. Megan Salon  for ongoing HIV care. Condoms provided today.   Janene Madeira, MSN, NP-C [place] for Infect[facility]  04/16/2017 9:54 AM

## 2017-04-17 ENCOUNTER — Other Ambulatory Visit: Payer: Self-pay | Admitting: Internal Medicine

## 2017-04-17 DIAGNOSIS — B20 Human immunodeficiency virus [HIV] disease: Secondary | ICD-10-CM

## 2017-04-17 LAB — CBC
HEMATOCRIT: 42 % (ref 35.0–45.0)
HEMOGLOBIN: 14.3 g/dL (ref 11.7–15.5)
MCH: 32.9 pg (ref 27.0–33.0)
MCHC: 34 g/dL (ref 32.0–36.0)
MCV: 96.8 fL (ref 80.0–100.0)
MPV: 9.7 fL (ref 7.5–12.5)
Platelets: 270 10*3/uL (ref 140–400)
RBC: 4.34 10*6/uL (ref 3.80–5.10)
RDW: 12.7 % (ref 11.0–15.0)
WBC: 8.3 10*3/uL (ref 3.8–10.8)

## 2017-04-17 LAB — CYTOLOGY - PAP
Adequacy: ABSENT
Chlamydia: NEGATIVE
DIAGNOSIS: NEGATIVE
Neisseria Gonorrhea: NEGATIVE

## 2017-04-17 LAB — COMPREHENSIVE METABOLIC PANEL
AG RATIO: 1.5 (calc) (ref 1.0–2.5)
ALBUMIN MSPROF: 4.4 g/dL (ref 3.6–5.1)
ALKALINE PHOSPHATASE (APISO): 41 U/L (ref 33–115)
ALT: 11 U/L (ref 6–29)
AST: 14 U/L (ref 10–35)
BILIRUBIN TOTAL: 0.4 mg/dL (ref 0.2–1.2)
BUN: 10 mg/dL (ref 7–25)
CALCIUM: 9.6 mg/dL (ref 8.6–10.2)
CO2: 19 mmol/L — AB (ref 20–32)
Chloride: 110 mmol/L (ref 98–110)
Creat: 0.87 mg/dL (ref 0.50–1.10)
Globulin: 2.9 g/dL (calc) (ref 1.9–3.7)
Glucose, Bld: 92 mg/dL (ref 65–99)
POTASSIUM: 4.7 mmol/L (ref 3.5–5.3)
SODIUM: 137 mmol/L (ref 135–146)
TOTAL PROTEIN: 7.3 g/dL (ref 6.1–8.1)

## 2017-04-17 LAB — T-HELPER CELL (CD4) - (RCID CLINIC ONLY)
CD4 % Helper T Cell: 47 % (ref 33–55)
CD4 T CELL ABS: 1440 /uL (ref 400–2700)

## 2017-04-17 LAB — LIPID PANEL
CHOL/HDL RATIO: 2.8 (calc) (ref <5.0)
CHOLESTEROL: 96 mg/dL (ref <200)
HDL: 34 mg/dL — ABNORMAL LOW (ref >50)
LDL CHOLESTEROL (CALC): 37 mg/dL
Non-HDL Cholesterol (Calc): 62 mg/dL (calc) (ref <130)
Triglycerides: 174 mg/dL — ABNORMAL HIGH (ref <150)

## 2017-04-17 LAB — RPR: RPR Ser Ql: NONREACTIVE

## 2017-04-18 LAB — HIV-1 RNA QUANT-NO REFLEX-BLD
HIV 1 RNA Quant: 119 copies/mL — ABNORMAL HIGH
HIV-1 RNA QUANT, LOG: 2.08 {Log_copies}/mL — AB

## 2017-04-19 ENCOUNTER — Other Ambulatory Visit: Payer: Self-pay

## 2017-05-08 ENCOUNTER — Ambulatory Visit: Payer: Self-pay | Admitting: Internal Medicine

## 2017-05-24 ENCOUNTER — Ambulatory Visit: Payer: Self-pay | Admitting: Internal Medicine

## 2017-06-21 ENCOUNTER — Encounter: Payer: Self-pay | Admitting: Internal Medicine

## 2017-06-21 ENCOUNTER — Ambulatory Visit (INDEPENDENT_AMBULATORY_CARE_PROVIDER_SITE_OTHER): Payer: Medicaid Other | Admitting: Internal Medicine

## 2017-06-21 DIAGNOSIS — B20 Human immunodeficiency virus [HIV] disease: Secondary | ICD-10-CM

## 2017-06-21 NOTE — Progress Notes (Signed)
Patient Active Problem List   Diagnosis Date Noted  . Human immunodeficiency virus (HIV) disease ([place]) 01/29/2009    Priority: High  . Polysubstance abuse ([place]) 10/22/2013  . Skull fracture ([place]) 10/22/2013  . Dyslipidemia 10/22/2013  . SCHIZOAFFECTIVE DISORDER 01/29/2009  . Borderline personality disorder ([place]) 01/29/2009  . MILD MENTAL RETARDATION 01/29/2009  . ALLERGIC RHINITIS, SEASONAL 01/29/2009      Medication List        Accurate as of 06/21/17 10:03 AM. Always use your most recent med list.          benztropine 1 MG tablet Commonly known as:  COGENTIN   * divalproex 500 MG DR tablet Commonly known as:  DEPAKOTE   * divalproex 250 MG DR tablet Commonly known as:  DEPAKOTE   docusate sodium 100 MG capsule Commonly known as:  COLACE   haloperidol 10 MG tablet Commonly known as:  HALDOL   haloperidol decanoate 50 MG/ML injection Commonly known as:  HALDOL DECANOATE   hydrOXYzine 25 MG capsule Commonly known as:  VISTARIL   lithium carbonate 300 MG capsule   medroxyPROGESTERone 150 MG/ML injection Commonly known as:  DEPO-PROVERA   naproxen 500 MG tablet Commonly known as:  NAPROSYN   OLANZapine 20 MG tablet Commonly known as:  ZYPREXA   trihexyphenidyl 2 MG tablet Commonly known as:  ARTANE   TRIUMEQ 600-50-300 MG tablet Generic drug:  abacavir-dolutegravir-lamiVUDine TAKE ONE TABLET BY MOUTH DAILY   Vitamin D3 2000 units Tabs      * This list has 2 medication(s) that are the same as other medications prescribed for you. Read the directions carefully, and ask your doctor or other care provider to review them with you.          Subjective: Karen Mcguire is in for her routine HIV follow-up visit. He continues to reside in the group home in [place].She is accompanied by a caregiver from the group home today. Records indicate that she is receiving Triumeq every morning at 8 AM and has not missed doses.  Review of Systems: Review  of Systems  Unable to perform ROS: Mental acuity    Past Medical History:  Diagnosis Date  . Bipolar disorder ([place])   . Hepatitis B carrier ([place])   . Hepatitis C without mention of hepatic coma   . History of traumatic head injury   . HIV infection ([place])   . MR (mental retardation), moderate   . Schizoaffective disorder ([place])     Social History   Tobacco Use  . Smoking status: Current Every Day Smoker    Packs/day: 0.40    Years: 2.00    Pack years: 0.80    Types: Cigarettes  . Smokeless tobacco: Former Systems developer  . Tobacco comment: 7 cigs per day  Substance Use Topics  . Alcohol use: No    Alcohol/week: 0.0 oz  . Drug use: No    Comment: used marijuana, pills, crack, heroin. Nothing since she has been at [facility] .    No family history on file.  Allergies  Allergen Reactions  . Penicillins Swelling    Health Maintenance  Topic Date Due  . TETANUS/TDAP  09/22/1986  . INFLUENZA VACCINE  02/21/2017  . PAP SMEAR  04/16/2020  . HIV Screening  Completed    Objective:  Vitals:   06/21/17 0951  BP: 112/79  Pulse: 85  Temp: 98.2 F (36.8 C)  TempSrc: Oral  Weight: 167 lb (75.8  kg)  Height: 5\' 5"  (1.651 m)   Body mass index is 27.79 kg/m.  Physical Exam  Constitutional: She is oriented to person, place, and time.  She is talkative and in good spirits as usual.  HENT:  Mouth/Throat: No oropharyngeal exudate.  Cardiovascular: Normal rate.  No murmur heard. Pulmonary/Chest: Effort normal and breath sounds normal. She has no wheezes. She has no rales.  Abdominal: There is no tenderness.  Neurological: She is alert and oriented to person, place, and time.  Skin: No rash noted.  Psychiatric: Mood normal.    Lab Results Lab Results  Component Value Date   WBC 8.3 04/16/2017   HGB 14.3 04/16/2017   HCT 42.0 04/16/2017   MCV 96.8 04/16/2017   PLT 270 04/16/2017    Lab Results  Component Value Date   CREATININE 0.87 04/16/2017   BUN 10 04/16/2017    NA 137 04/16/2017   K 4.7 04/16/2017   CL 110 04/16/2017   CO2 19 (L) 04/16/2017    Lab Results  Component Value Date   ALT 11 04/16/2017   AST 14 04/16/2017   ALKPHOS 49 03/14/2016   BILITOT 0.4 04/16/2017    Lab Results  Component Value Date   CHOL 96 04/16/2017   HDL 34 (L) 04/16/2017   LDLCALC 21 03/14/2016   TRIG 174 (H) 04/16/2017   CHOLHDL 2.8 04/16/2017   Lab Results  Component Value Date   LABRPR NON-REACTIVE 04/16/2017   HIV 1 RNA Quant (copies/mL)  Date Value  04/16/2017 119 (H)  03/14/2016 37 (H)  12/17/2014 56 (H)   CD4 T Cell Abs (/uL)  Date Value  04/16/2017 1,440  03/14/2016 840  12/17/2014 1,380     Problem List Items Addressed This Visit      High   Human immunodeficiency virus (HIV) disease ([place])    She has low-level HIV viral activation but overall her infection is under good long-term control with Triumeq. I will not make any changes. She can follow-up here after blood work in one year      Relevant Orders   CBC   T-helper cell (CD4)- ([facility] clinic only)   Comprehensive metabolic panel   Lipid panel   RPR   HIV 1 RNA quant-no reflex-bld        Michel Bickers, MD [facility] for [place] [phone] pager   [phone] cell 06/21/2017, 10:03 AM

## 2017-06-21 NOTE — Assessment & Plan Note (Signed)
She has low-level HIV viral activation but overall her infection is under good long-term control with Triumeq. I will not make any changes. She can follow-up here after blood work in one year

## 2017-10-23 ENCOUNTER — Other Ambulatory Visit: Payer: Self-pay | Admitting: Internal Medicine

## 2017-10-23 DIAGNOSIS — B20 Human immunodeficiency virus [HIV] disease: Secondary | ICD-10-CM

## 2018-03-14 ENCOUNTER — Other Ambulatory Visit (HOSPITAL_COMMUNITY): Payer: Self-pay | Admitting: Family Medicine

## 2018-04-03 ENCOUNTER — Ambulatory Visit (HOSPITAL_COMMUNITY)
Admission: RE | Admit: 2018-04-03 | Discharge: 2018-04-03 | Disposition: A | Discharge status: Home / Self Care | Payer: Medicaid Other | Source: Ambulatory Visit | Attending: Family Medicine | Admitting: Family Medicine

## 2018-04-03 ENCOUNTER — Other Ambulatory Visit (HOSPITAL_COMMUNITY): Payer: Self-pay | Admitting: Family Medicine

## 2018-04-03 DIAGNOSIS — R52 Pain, unspecified: Secondary | ICD-10-CM

## 2018-04-03 DIAGNOSIS — M79641 Pain in right hand: Secondary | ICD-10-CM | POA: Diagnosis not present

## 2018-06-25 ENCOUNTER — Ambulatory Visit: Payer: Self-pay | Admitting: Internal Medicine

## 2018-07-10 ENCOUNTER — Telehealth: Payer: Self-pay

## 2018-07-10 ENCOUNTER — Other Ambulatory Visit: Payer: Self-pay | Admitting: Infectious Diseases

## 2018-07-10 DIAGNOSIS — B20 Human immunodeficiency virus [HIV] disease: Secondary | ICD-10-CM

## 2018-07-10 NOTE — Telephone Encounter (Signed)
Called patient to reschedule missed one year follow-up appointment. Called number listed which was answered by Mechele Claude who works with [facility]. Mechele Claude was able to schedule an appointment for patient as well as set up appointment.Chart was updated with new contact information provided by Mechele Claude. Patient will need to do lab appointments same day as office visit Aundria Rud, [place]

## 2018-07-15 ENCOUNTER — Ambulatory Visit (INDEPENDENT_AMBULATORY_CARE_PROVIDER_SITE_OTHER): Payer: Medicaid Other | Admitting: Internal Medicine

## 2018-07-15 ENCOUNTER — Encounter: Payer: Self-pay | Admitting: Internal Medicine

## 2018-07-15 DIAGNOSIS — B20 Human immunodeficiency virus [HIV] disease: Secondary | ICD-10-CM | POA: Diagnosis not present

## 2018-07-15 NOTE — Assessment & Plan Note (Signed)
Her infection has been under good, long-term control.  She will get repeat lab work today and continue [place].  She will follow-up in 1 year.

## 2018-07-15 NOTE — Progress Notes (Signed)
Patient Active Problem List   Diagnosis Date Noted  . Human immunodeficiency virus (HIV) disease ([place]) 01/29/2009    Priority: High  . Polysubstance abuse ([place]) 10/22/2013  . Skull fracture ([place]) 10/22/2013  . Dyslipidemia 10/22/2013  . SCHIZOAFFECTIVE DISORDER 01/29/2009  . Borderline personality disorder ([place]) 01/29/2009  . MILD MENTAL RETARDATION 01/29/2009  . ALLERGIC RHINITIS, SEASONAL 01/29/2009    Patient's Medications  New Prescriptions   No medications on file  Previous Medications   BENZTROPINE (COGENTIN) 1 MG TABLET    Take 1 mg by mouth every 6 (six) hours as needed for tremors.    CHOLECALCIFEROL (VITAMIN D3) 2000 UNITS TABS    Take 2,000 Units by mouth daily.   DIVALPROEX (DEPAKOTE) 250 MG DR TABLET    Take 250 mg by mouth daily.   DIVALPROEX (DEPAKOTE) 500 MG DR TABLET    Take 1,000 mg by mouth at bedtime. Taking two and one half tablets at night per FL-2 /tkk   DOCUSATE SODIUM (COLACE) 100 MG CAPSULE    Take 100 mg by mouth 3 (three) times daily.   HALOPERIDOL (HALDOL) 10 MG TABLET    Take 10 mg by mouth daily.   HALOPERIDOL DECANOATE (HALDOL DECANOATE) 50 MG/ML INJECTION    Inject 150 mg into the muscle every 28 (twenty-eight) days.   HYDROXYZINE (VISTARIL) 25 MG CAPSULE    Take 25 mg by mouth at bedtime.    LITHIUM CARBONATE 300 MG CAPSULE    Take 300 mg by mouth 2 (two) times daily.   MEDROXYPROGESTERONE (DEPO-PROVERA) 150 MG/ML INJECTION    Inject 150 mg into the muscle every 3 (three) months.   NAPROXEN (NAPROSYN) 500 MG TABLET    Take 500 mg by mouth 2 (two) times daily with a meal.   OLANZAPINE (ZYPREXA) 20 MG TABLET    Take 20 mg by mouth at bedtime.   TRIHEXYPHENIDYL (ARTANE) 2 MG TABLET    Take 2 mg by mouth 3 (three) times daily with meals.   TRIUMEQ 600-50-300 MG TABLET    TAKE ONE TABLET BY MOUTH DAILY  Modified Medications   No medications on file  Discontinued Medications   No medications on file    Subjective: Karen Mcguire is in for her  routine HIV follow-up visit.  She continues to reside in a group home in [place].  Her Triumeq is administered to her every day.  She tells me that she has been losing weight and indeed she has lost a great deal of weight over the last several years.  She says that her appetite is great.  She eats lots of silence.  Her caregiver from the group home states that she thinks she has been much more active over the last year.  Review of Systems: Review of Systems  Constitutional: Positive for weight loss. Negative for chills, diaphoresis and fever.  Respiratory: Negative for cough.   Cardiovascular: Negative for chest pain.  Gastrointestinal: Positive for constipation. Negative for abdominal pain, diarrhea, nausea and vomiting.  Skin: Negative for rash.  Psychiatric/Behavioral: Negative for depression.    Past Medical History:  Diagnosis Date  . Bipolar disorder ([place])   . Hepatitis B carrier ([place])   . Hepatitis C without mention of hepatic coma   . History of traumatic head injury   . HIV infection ([place])   . MR (mental retardation), moderate   . Schizoaffective disorder ([place])     Social History   Tobacco Use  .  Smoking status: Current Every Day Smoker    Packs/day: 0.40    Years: 2.00    Pack years: 0.80    Types: Cigarettes  . Smokeless tobacco: Former Systems developer  . Tobacco comment: 7 cigs per day  Substance Use Topics  . Alcohol use: No    Alcohol/week: 0.0 standard drinks  . Drug use: No    Comment: used marijuana, pills, crack, heroin. Nothing since she has been at [facility] .    No family history on file.  Allergies  Allergen Reactions  . Penicillins Swelling    Health Maintenance  Topic Date Due  . TETANUS/TDAP  09/22/1986  . MAMMOGRAM  09/21/2017  . COLONOSCOPY  09/21/2017  . INFLUENZA VACCINE  02/21/2018  . PAP SMEAR-Modifier  04/16/2020  . HIV Screening  Completed    Objective:  Vitals:   07/15/18 1440  BP: 112/76  Pulse: (!) 109  Temp: 98 F (36.7 C)    Weight: 150 lb (68 kg)  Height: 5\' 3"  (1.6 m)   Body mass index is 26.57 kg/m.  Physical Exam Constitutional:      Comments: She is talkative and in good spirits.  She has lost 17 pounds over the last year and 66 pounds over the last 4 years.  HENT:     Mouth/Throat:     Pharynx: No oropharyngeal exudate.  Cardiovascular:     Rate and Rhythm: Normal rate and regular rhythm.     Heart sounds: No murmur.  Pulmonary:     Effort: Pulmonary effort is normal.     Breath sounds: Normal breath sounds.  Abdominal:     General: Abdomen is flat. There is no distension.     Tenderness: There is no abdominal tenderness.  Skin:    Findings: No rash.  Psychiatric:        Mood and Affect: Mood normal.     Lab Results Lab Results  Component Value Date   WBC 8.3 04/16/2017   HGB 14.3 04/16/2017   HCT 42.0 04/16/2017   MCV 96.8 04/16/2017   PLT 270 04/16/2017    Lab Results  Component Value Date   CREATININE 0.87 04/16/2017   BUN 10 04/16/2017   NA 137 04/16/2017   K 4.7 04/16/2017   CL 110 04/16/2017   CO2 19 (L) 04/16/2017    Lab Results  Component Value Date   ALT 11 04/16/2017   AST 14 04/16/2017   ALKPHOS 49 03/14/2016   BILITOT 0.4 04/16/2017    Lab Results  Component Value Date   CHOL 96 04/16/2017   HDL 34 (L) 04/16/2017   LDLCALC 37 04/16/2017   TRIG 174 (H) 04/16/2017   CHOLHDL 2.8 04/16/2017   Lab Results  Component Value Date   LABRPR NON-REACTIVE 04/16/2017   HIV 1 RNA Quant (copies/mL)  Date Value  04/16/2017 119 (H)  03/14/2016 37 (H)  12/17/2014 56 (H)   CD4 T Cell Abs (/uL)  Date Value  04/16/2017 1,440  03/14/2016 840  12/17/2014 1,380     Problem List Items Addressed This Visit      High   Human immunodeficiency virus (HIV) disease ([place])    Her infection has been under good, long-term control.  She will get repeat lab work today and continue [place].  She will follow-up in 1 year.      Relevant Orders   T-helper cell (CD4)- ([facility]  clinic only)   HIV-1 RNA quant-no reflex-bld   CBC   Comprehensive metabolic  panel   RPR   Lipid panel        Michel Bickers, MD [facility] for Infectious [place] [phone] pager   [phone] cell 07/15/2018, 3:01 PM

## 2018-07-16 LAB — T-HELPER CELL (CD4) - (RCID CLINIC ONLY)
CD4 % Helper T Cell: 48 % (ref 33–55)
CD4 T Cell Abs: 1340 /uL (ref 400–2700)

## 2018-07-20 LAB — CBC
HCT: 41.4 % (ref 35.0–45.0)
Hemoglobin: 14.1 g/dL (ref 11.7–15.5)
MCH: 33.2 pg — ABNORMAL HIGH (ref 27.0–33.0)
MCHC: 34.1 g/dL (ref 32.0–36.0)
MCV: 97.4 fL (ref 80.0–100.0)
MPV: 9.5 fL (ref 7.5–12.5)
Platelets: 298 10*3/uL (ref 140–400)
RBC: 4.25 10*6/uL (ref 3.80–5.10)
RDW: 12.8 % (ref 11.0–15.0)
WBC: 12.7 10*3/uL — ABNORMAL HIGH (ref 3.8–10.8)

## 2018-07-20 LAB — COMPREHENSIVE METABOLIC PANEL
AG Ratio: 1.3 (calc) (ref 1.0–2.5)
ALT: 14 U/L (ref 6–29)
AST: 15 U/L (ref 10–35)
Albumin: 4 g/dL (ref 3.6–5.1)
Alkaline phosphatase (APISO): 55 U/L (ref 33–130)
BUN/Creatinine Ratio: 8 (calc) (ref 6–22)
BUN: 13 mg/dL (ref 7–25)
CO2: 24 mmol/L (ref 20–32)
Calcium: 9.5 mg/dL (ref 8.6–10.4)
Chloride: 110 mmol/L (ref 98–110)
Creat: 1.69 mg/dL — ABNORMAL HIGH (ref 0.50–1.05)
GLOBULIN: 3.1 g/dL (ref 1.9–3.7)
Glucose, Bld: 106 mg/dL — ABNORMAL HIGH (ref 65–99)
POTASSIUM: 4.8 mmol/L (ref 3.5–5.3)
Sodium: 139 mmol/L (ref 135–146)
Total Bilirubin: 0.3 mg/dL (ref 0.2–1.2)
Total Protein: 7.1 g/dL (ref 6.1–8.1)

## 2018-07-20 LAB — LIPID PANEL
Cholesterol: 98 mg/dL (ref <200)
HDL: 40 mg/dL — ABNORMAL LOW (ref >50)
LDL Cholesterol (Calc): 36 mg/dL (calc)
Non-HDL Cholesterol (Calc): 58 mg/dL (calc) (ref <130)
Total CHOL/HDL Ratio: 2.5 (calc) (ref <5.0)
Triglycerides: 138 mg/dL (ref <150)

## 2018-07-20 LAB — HIV-1 RNA QUANT-NO REFLEX-BLD
HIV 1 RNA Quant: 45 copies/mL — ABNORMAL HIGH
HIV-1 RNA Quant, Log: 1.65 Log copies/mL — ABNORMAL HIGH

## 2018-07-20 LAB — RPR: RPR Ser Ql: NONREACTIVE

## 2018-08-09 ENCOUNTER — Other Ambulatory Visit: Payer: Self-pay | Admitting: Internal Medicine

## 2018-08-09 DIAGNOSIS — B20 Human immunodeficiency virus [HIV] disease: Secondary | ICD-10-CM

## 2019-07-01 ENCOUNTER — Ambulatory Visit: Payer: Medicaid Other | Admitting: Internal Medicine

## 2019-07-30 ENCOUNTER — Other Ambulatory Visit: Payer: Self-pay | Admitting: Internal Medicine

## 2019-07-30 DIAGNOSIS — B20 Human immunodeficiency virus [HIV] disease: Secondary | ICD-10-CM

## 2019-08-20 ENCOUNTER — Other Ambulatory Visit: Payer: Self-pay | Admitting: Internal Medicine

## 2019-08-20 DIAGNOSIS — B20 Human immunodeficiency virus [HIV] disease: Secondary | ICD-10-CM

## 2019-10-30 ENCOUNTER — Other Ambulatory Visit: Payer: Medicaid Other

## 2019-10-30 ENCOUNTER — Other Ambulatory Visit: Payer: Self-pay

## 2019-10-30 DIAGNOSIS — Z79899 Other long term (current) drug therapy: Secondary | ICD-10-CM

## 2019-10-30 DIAGNOSIS — B20 Human immunodeficiency virus [HIV] disease: Secondary | ICD-10-CM

## 2019-10-30 DIAGNOSIS — Z113 Encounter for screening for infections with a predominantly sexual mode of transmission: Secondary | ICD-10-CM

## 2019-10-31 LAB — T-HELPER CELL (CD4) - (RCID CLINIC ONLY)
CD4 % Helper T Cell: 49 % (ref 33–65)
CD4 T Cell Abs: 1131 /uL (ref 400–1790)

## 2019-11-01 LAB — COMPREHENSIVE METABOLIC PANEL
AG Ratio: 1.3 (calc) (ref 1.0–2.5)
ALT: 11 U/L (ref 6–29)
AST: 11 U/L (ref 10–35)
Albumin: 3.9 g/dL (ref 3.6–5.1)
Alkaline phosphatase (APISO): 47 U/L (ref 37–153)
BUN: 7 mg/dL (ref 7–25)
CO2: 21 mmol/L (ref 20–32)
Calcium: 9.6 mg/dL (ref 8.6–10.4)
Chloride: 110 mmol/L (ref 98–110)
Creat: 0.73 mg/dL (ref 0.50–1.05)
Globulin: 3.1 g/dL (calc) (ref 1.9–3.7)
Glucose, Bld: 105 mg/dL — ABNORMAL HIGH (ref 65–99)
Potassium: 4.5 mmol/L (ref 3.5–5.3)
Sodium: 140 mmol/L (ref 135–146)
Total Bilirubin: 0.4 mg/dL (ref 0.2–1.2)
Total Protein: 7 g/dL (ref 6.1–8.1)

## 2019-11-01 LAB — CBC WITH DIFFERENTIAL/PLATELET
Absolute Monocytes: 490 cells/uL (ref 200–950)
Basophils Absolute: 40 cells/uL (ref 0–200)
Basophils Relative: 0.5 %
Eosinophils Absolute: 119 cells/uL (ref 15–500)
Eosinophils Relative: 1.5 %
HCT: 40.4 % (ref 35.0–45.0)
Hemoglobin: 13.8 g/dL (ref 11.7–15.5)
Lymphs Abs: 2378 cells/uL (ref 850–3900)
MCH: 33.2 pg — ABNORMAL HIGH (ref 27.0–33.0)
MCHC: 34.2 g/dL (ref 32.0–36.0)
MCV: 97.1 fL (ref 80.0–100.0)
MPV: 9.4 fL (ref 7.5–12.5)
Monocytes Relative: 6.2 %
Neutro Abs: 4874 cells/uL (ref 1500–7800)
Neutrophils Relative %: 61.7 %
Platelets: 270 10*3/uL (ref 140–400)
RBC: 4.16 10*6/uL (ref 3.80–5.10)
RDW: 13.1 % (ref 11.0–15.0)
Total Lymphocyte: 30.1 %
WBC: 7.9 10*3/uL (ref 3.8–10.8)

## 2019-11-01 LAB — LIPID PANEL
Cholesterol: 83 mg/dL (ref <200)
HDL: 39 mg/dL — ABNORMAL LOW (ref >=50)
LDL Cholesterol (Calc): 23 mg/dL (calc)
Non-HDL Cholesterol (Calc): 44 mg/dL (calc) (ref <130)
Total CHOL/HDL Ratio: 2.1 (calc) (ref <5.0)
Triglycerides: 126 mg/dL (ref <150)

## 2019-11-01 LAB — RPR: RPR Ser Ql: NONREACTIVE

## 2019-11-01 LAB — HIV-1 RNA QUANT-NO REFLEX-BLD
HIV 1 RNA Quant: 65 copies/mL — ABNORMAL HIGH
HIV-1 RNA Quant, Log: 1.81 Log copies/mL — ABNORMAL HIGH

## 2019-11-27 ENCOUNTER — Other Ambulatory Visit: Payer: Self-pay

## 2019-11-27 ENCOUNTER — Encounter: Payer: Self-pay | Admitting: Internal Medicine

## 2019-11-27 ENCOUNTER — Ambulatory Visit (INDEPENDENT_AMBULATORY_CARE_PROVIDER_SITE_OTHER): Payer: Medicaid Other | Admitting: Internal Medicine

## 2019-11-27 DIAGNOSIS — B20 Human immunodeficiency virus [HIV] disease: Secondary | ICD-10-CM

## 2019-11-27 DIAGNOSIS — F251 Schizoaffective disorder, depressive type: Secondary | ICD-10-CM | POA: Diagnosis not present

## 2019-11-27 DIAGNOSIS — K089 Disorder of teeth and supporting structures, unspecified: Secondary | ICD-10-CM | POA: Diagnosis present

## 2019-11-27 NOTE — Assessment & Plan Note (Signed)
Dental referral placed today for [facility]. Information to schedule appointment completed today.   

## 2019-11-27 NOTE — Assessment & Plan Note (Signed)
Her infection range remains under very good, long-term control. She will continue Triumeq and follow-up here after lab work in 1 year.

## 2019-11-27 NOTE — Progress Notes (Signed)
Patient Active Problem List   Diagnosis Date Noted  . Human immunodeficiency virus (HIV) disease ([place]) 01/29/2009    Priority: High  . Poor dentition 11/27/2019  . Polysubstance abuse ([place]) 10/22/2013  . Skull fracture ([place]) 10/22/2013  . Dyslipidemia 10/22/2013  . Schizoaffective disorder ([place]) 01/29/2009  . Borderline personality disorder ([place]) 01/29/2009  . MILD MENTAL RETARDATION 01/29/2009  . ALLERGIC RHINITIS, SEASONAL 01/29/2009    Patient's Medications  New Prescriptions   No medications on file  Previous Medications   BENZTROPINE (COGENTIN) 1 MG TABLET    Take 1 mg by mouth every 6 (six) hours as needed for tremors.    CETIRIZINE (ZYRTEC) 10 MG TABLET    Take 10 mg by mouth daily.   CHOLECALCIFEROL (VITAMIN D3) 2000 UNITS TABS    Take 2,000 Units by mouth daily.   DIVALPROEX (DEPAKOTE) 250 MG DR TABLET    Take 250 mg by mouth daily.   DIVALPROEX (DEPAKOTE) 500 MG DR TABLET    Take 1,000 mg by mouth at bedtime. Taking two and one half tablets at night per FL-2 /tkk   DOCUSATE SODIUM (COLACE) 100 MG CAPSULE    Take 100 mg by mouth 3 (three) times daily.   HALOPERIDOL (HALDOL) 10 MG TABLET    Take 5 mg by mouth 2 (two) times daily as needed (anxiety).    HALOPERIDOL DECANOATE (HALDOL DECANOATE) 50 MG/ML INJECTION    Inject 150 mg into the muscle every 28 (twenty-eight) days.   HYDROXYZINE (VISTARIL) 25 MG CAPSULE    Take 25 mg by mouth at bedtime.    LITHIUM CARBONATE 300 MG CAPSULE    Take 300 mg by mouth 2 (two) times daily.   MEDROXYPROGESTERONE (DEPO-PROVERA) 150 MG/ML INJECTION    Inject 150 mg into the muscle every 3 (three) months.   NAPROXEN (NAPROSYN) 500 MG TABLET    Take 500 mg by mouth 2 (two) times daily with a meal.   OLANZAPINE (ZYPREXA) 20 MG TABLET    Take 20 mg by mouth at bedtime.   TRIHEXYPHENIDYL (ARTANE) 2 MG TABLET    Take 2 mg by mouth 3 (three) times daily with meals.   TRIUMEQ 600-50-300 MG TABLET    TAKE ONE TABLET BY MOUTH DAILY   VALBENAZINE TOSYLATE (INGREZZA) 40 MG CAPS    Take 40 mg by mouth daily.  Modified Medications   No medications on file  Discontinued Medications   No medications on file    Subjective: Karen Mcguire is in for her routine HIV follow-up visit. She continues to reside in her group home. She has being given Triumeq every day. She denies any problems tolerating it. She says that she continues to have stable, intermittent depression but most days feels very good. She says that she has had fluctuating weight and has been trying to gain. She is not getting much regular exercise. She has received both of her Covid vaccines. She says she is concerned about having bad breath and says that her teeth are in poor condition. She has not seen a dentist in many years.  Review of Systems: Review of Systems  Constitutional: Negative for chills, diaphoresis, fever, malaise/fatigue and weight loss.  HENT: Negative for sore throat.   Respiratory: Negative for cough, sputum production and shortness of breath.   Cardiovascular: Negative for chest pain.  Gastrointestinal: Negative for abdominal pain, diarrhea, heartburn, nausea and vomiting.  Genitourinary: Negative for dysuria and frequency.  Musculoskeletal: Negative for  joint pain and myalgias.  Skin: Negative for rash.  Neurological: Negative for dizziness and headaches.  Psychiatric/Behavioral: Positive for depression. Negative for substance abuse. The patient is not nervous/anxious.     Past Medical History:  Diagnosis Date  . Bipolar disorder ([place])   . Hepatitis B carrier ([place])   . Hepatitis C without mention of hepatic coma   . History of traumatic head injury   . HIV infection ([place])   . MR (mental retardation), moderate   . Schizoaffective disorder ([place])     Social History   Tobacco Use  . Smoking status: Current Every Day Smoker    Packs/day: 0.40    Years: 2.00    Pack years: 0.80    Types: Cigarettes  . Smokeless tobacco: Former Systems developer  .  Tobacco comment: 7 cigs per day  Substance Use Topics  . Alcohol use: No    Alcohol/week: 0.0 standard drinks  . Drug use: No    Comment: used marijuana, pills, crack, heroin. Nothing since she has been at [facility] .    No family history on file.  Allergies  Allergen Reactions  . Penicillins Swelling    Health Maintenance  Topic Date Due  . COVID-19 Vaccine (1) Never done  . TETANUS/TDAP  Never done  . MAMMOGRAM  09/21/2017  . COLONOSCOPY  Never done  . INFLUENZA VACCINE  02/22/2020  . PAP SMEAR-Modifier  04/16/2020  . HIV Screening  Completed    Objective:  Vitals:   11/27/19 1032  BP: 107/71  Pulse: 90  Temp: 98.2 F (36.8 C)  TempSrc: Oral  SpO2: 100%  Weight: 152 lb (68.9 kg)   Body mass index is 26.93 kg/m.  Physical Exam Constitutional:      Comments: Her weight is stable over the past year. She is talkative and in good spirits.  HENT:     Mouth/Throat:     Mouth: Mucous membranes are moist.     Pharynx: No oropharyngeal exudate.     Comments: She has a loose right mandibular molar. Eyes:     Conjunctiva/sclera: Conjunctivae normal.  Cardiovascular:     Rate and Rhythm: Normal rate and regular rhythm.     Heart sounds: No murmur.  Pulmonary:     Effort: Pulmonary effort is normal.     Breath sounds: Normal breath sounds.  Abdominal:     Palpations: Abdomen is soft.     Tenderness: There is no abdominal tenderness.  Musculoskeletal:        General: No swelling or tenderness.  Skin:    Findings: No rash.  Neurological:     General: No focal deficit present.  Psychiatric:        Mood and Affect: Mood normal.     Lab Results Lab Results  Component Value Date   WBC 7.9 10/30/2019   HGB 13.8 10/30/2019   HCT 40.4 10/30/2019   MCV 97.1 10/30/2019   PLT 270 10/30/2019    Lab Results  Component Value Date   CREATININE 0.73 10/30/2019   BUN 7 10/30/2019   NA 140 10/30/2019   K 4.5 10/30/2019   CL 110 10/30/2019   CO2 21 10/30/2019      Lab Results  Component Value Date   ALT 11 10/30/2019   AST 11 10/30/2019   ALKPHOS 49 03/14/2016   BILITOT 0.4 10/30/2019    Lab Results  Component Value Date   CHOL 83 10/30/2019   HDL 39 (L) 10/30/2019   [place]  23 10/30/2019   TRIG 126 10/30/2019   CHOLHDL 2.1 10/30/2019   Lab Results  Component Value Date   LABRPR NON-REACTIVE 10/30/2019   HIV 1 RNA Quant (copies/mL)  Date Value  10/30/2019 65 (H)  07/15/2018 45 (H)  04/16/2017 119 (H)   CD4 T Cell Abs (/uL)  Date Value  10/30/2019 1,131  07/15/2018 1,340  04/16/2017 1,440     Problem List Items Addressed This Visit      High   Human immunodeficiency virus (HIV) disease ([place])    Her infection range remains under very good, long-term control. She will continue Triumeq and follow-up here after lab work in 1 year.      Relevant Orders   CBC   T-helper cell (CD4)- ([facility] clinic only)   Comprehensive metabolic panel   Lipid panel   RPR   HIV-1 RNA quant-no reflex-bld     Unprioritized   Schizoaffective disorder ([place])    Although she reports intermittent bouts of depression her mood seems quite good and stable.      Poor dentition    Dental referral placed today for [facility]. Information to schedule appointment completed today.             Michel Bickers, MD [facility] for Infectious [place] [phone] pager   [phone] cell 11/27/2019, 10:46 AM

## 2019-11-27 NOTE — Assessment & Plan Note (Signed)
Although she reports intermittent bouts of depression her mood seems quite good and stable.

## 2020-05-10 IMAGING — DX DG HAND COMPLETE 3+V*R*
3 series · 3 of 3 positions shown · non-contrast
Comparison: None.

CLINICAL DATA: Right hand pain.

EXAM:
RIGHT HAND - COMPLETE 3+ VIEW

[hand pa]
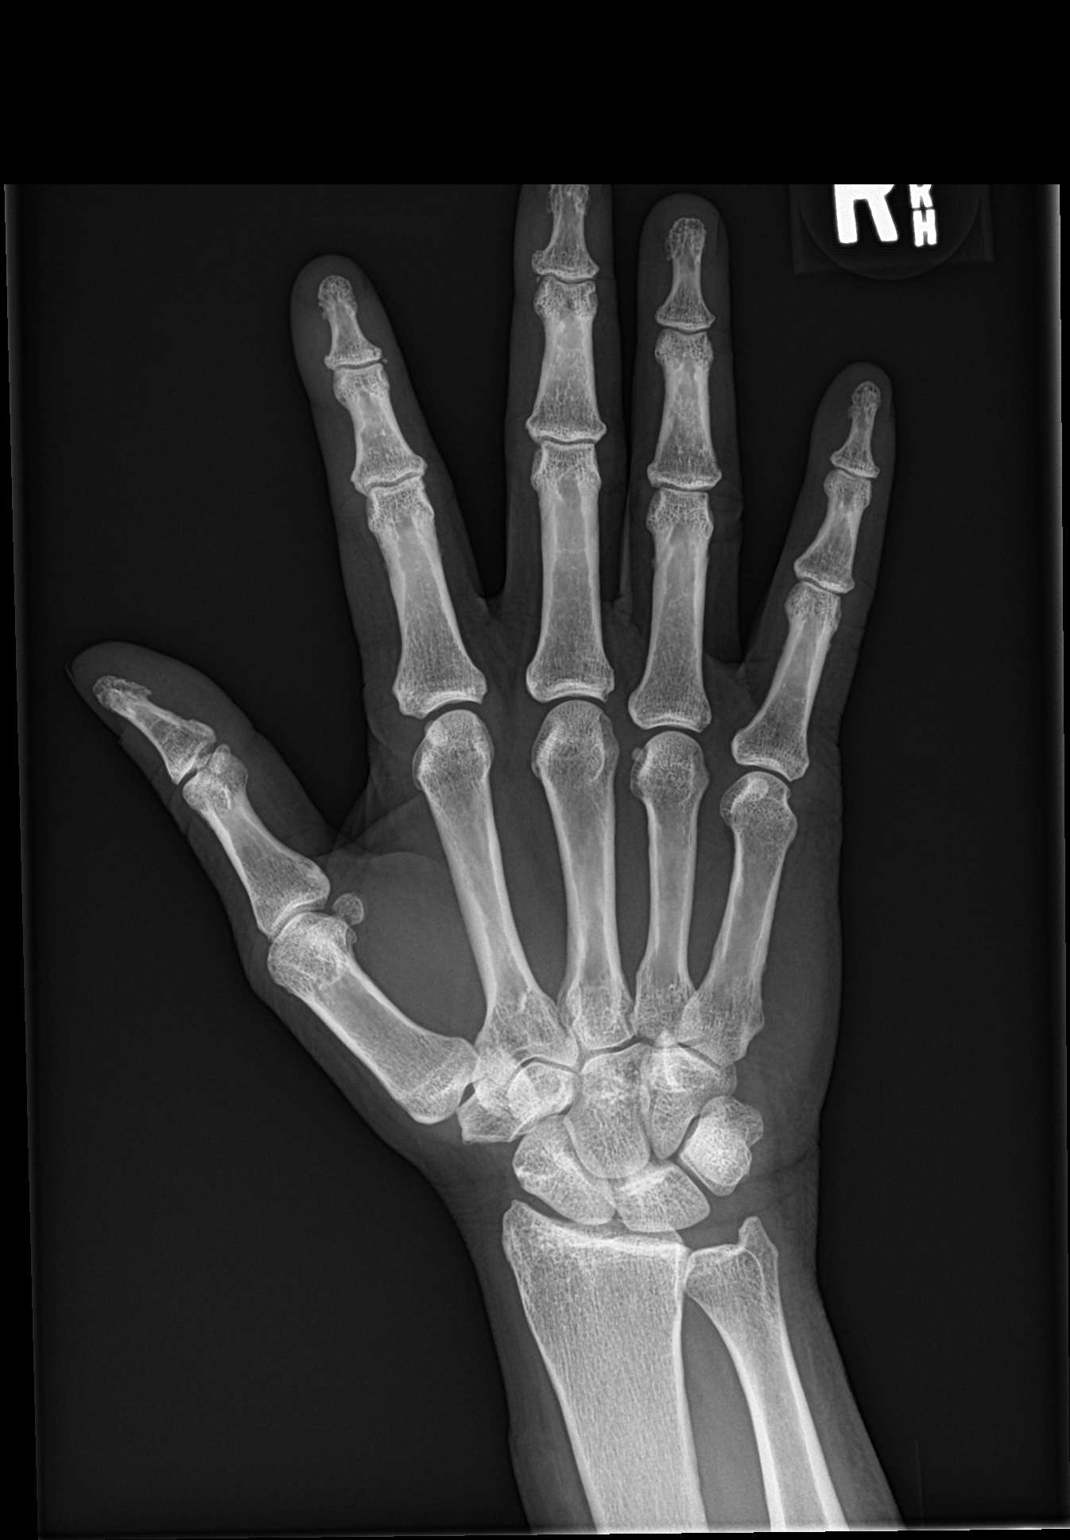

[hand obl]
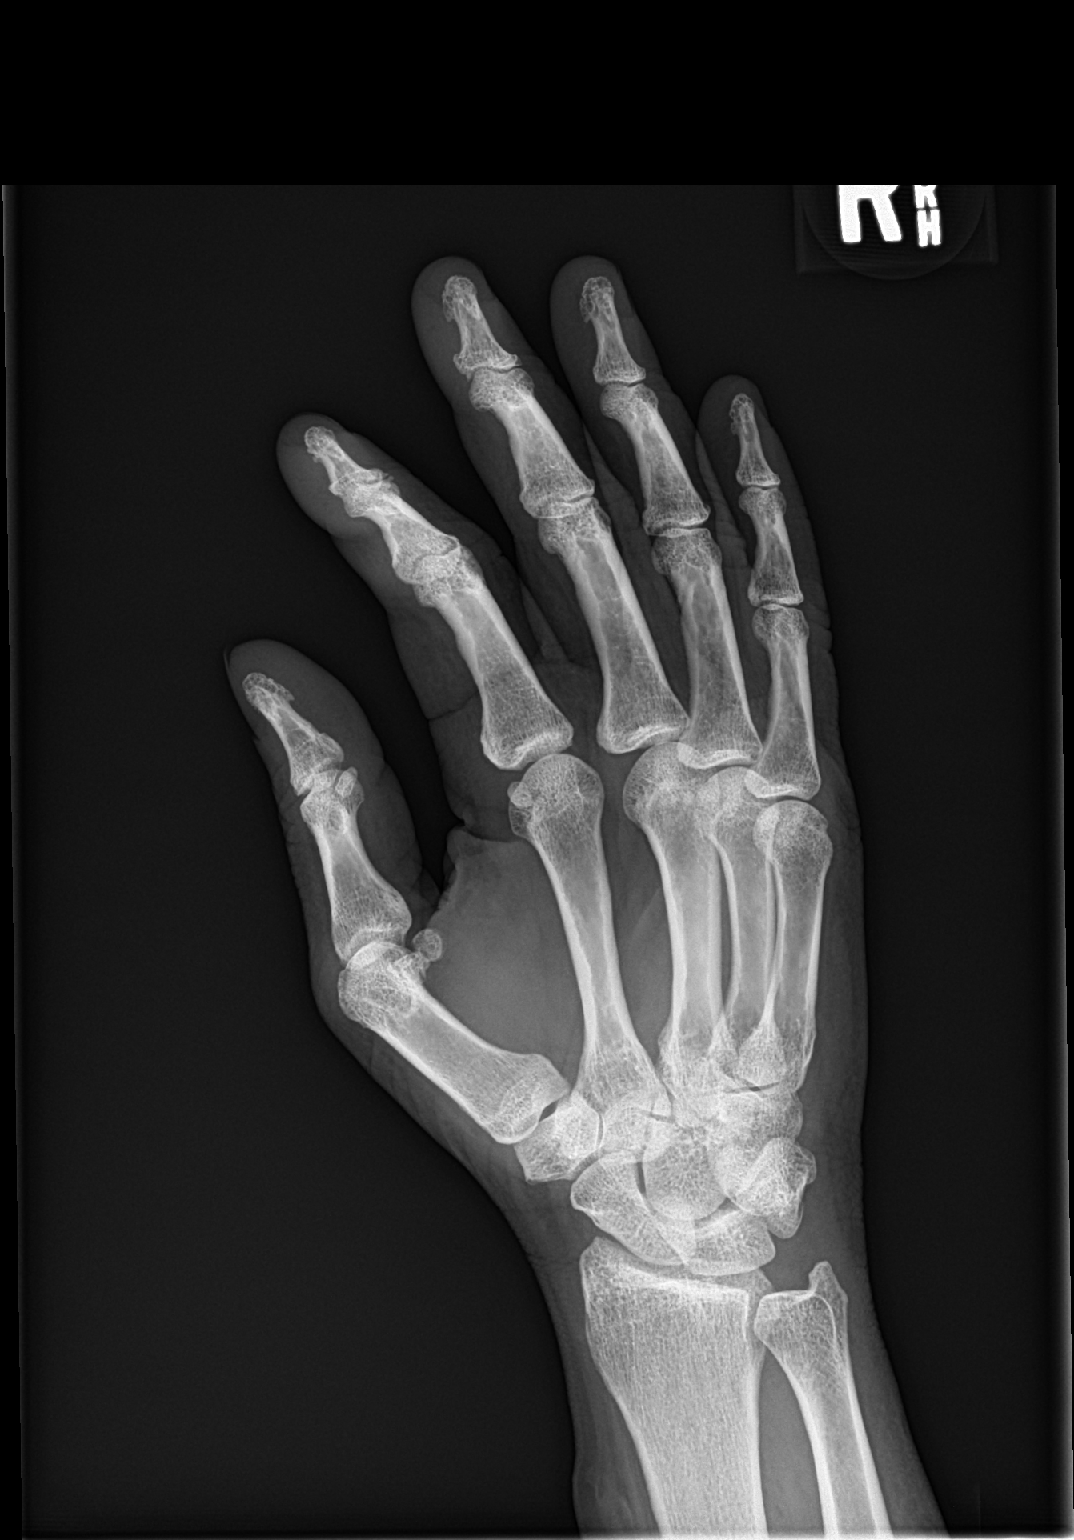

[hand lat]
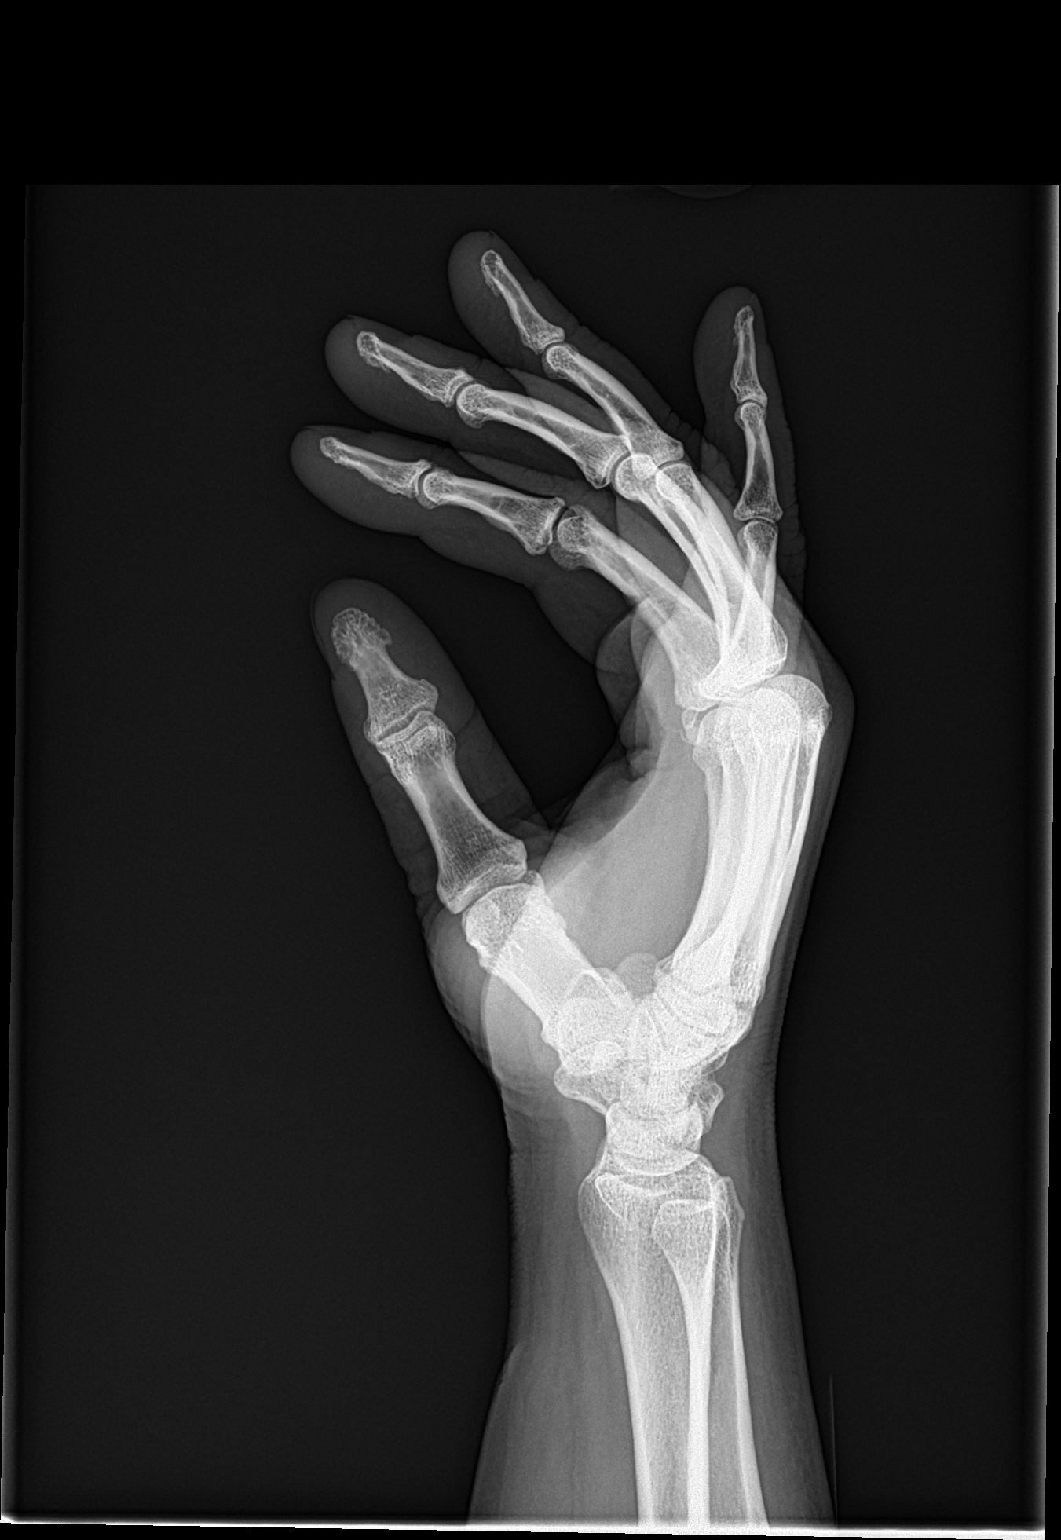

[3 of 3 positions shown; findings below may reference images not displayed]

FINDINGS: There is no evidence of fracture or dislocation. There is no
evidence of arthropathy or other focal bone abnormality. Soft
tissues are unremarkable.
IMPRESSION: Negative.

## 2020-11-18 ENCOUNTER — Telehealth: Payer: Self-pay

## 2020-11-18 ENCOUNTER — Ambulatory Visit: Payer: Medicaid Other | Admitting: Internal Medicine

## 2020-11-18 NOTE — Telephone Encounter (Signed)
RN spoke to patient's aide, Vito Backers, at [facility]. She was unaware that patient had appointment today, rescheduled for 11/23/20. RN did not discuss any private health information.   Beryle Flock, RN

## 2020-11-23 ENCOUNTER — Ambulatory Visit (INDEPENDENT_AMBULATORY_CARE_PROVIDER_SITE_OTHER): Payer: Medicaid Other | Admitting: Internal Medicine

## 2020-11-23 ENCOUNTER — Other Ambulatory Visit: Payer: Self-pay

## 2020-11-23 DIAGNOSIS — R634 Abnormal weight loss: Secondary | ICD-10-CM | POA: Diagnosis not present

## 2020-11-23 DIAGNOSIS — B20 Human immunodeficiency virus [HIV] disease: Secondary | ICD-10-CM | POA: Diagnosis not present

## 2020-11-23 DIAGNOSIS — F251 Schizoaffective disorder, depressive type: Secondary | ICD-10-CM

## 2020-11-23 NOTE — Assessment & Plan Note (Signed)
It sounds like the stress of the COVID pandemic and social distancing has caused some exacerbation of her chronic mental health condition.

## 2020-11-23 NOTE — Assessment & Plan Note (Signed)
Even though her weight is stable over the past year she is very concerned about the weight loss over the past several years.  I encouraged her to discuss the bleeding that she is noted when going to the bathroom with her nurse practitioner.

## 2020-11-23 NOTE — Assessment & Plan Note (Signed)
Her infection has been under very good long-term control due to being on directly observed therapy with Triumeq.  She will get lab work today, continue Triumeq and follow-up in 1 year.  Her [employer] says she is due to get her COVID booster soon.

## 2020-11-23 NOTE — Progress Notes (Signed)
Patient Active Problem List   Diagnosis Date Noted  . Human immunodeficiency virus (HIV) disease ([place]) 01/29/2009    Priority: High  . Unintentional weight loss 11/23/2020  . Poor dentition 11/27/2019  . Polysubstance abuse ([place]) 10/22/2013  . Skull fracture ([place]) 10/22/2013  . Dyslipidemia 10/22/2013  . Schizoaffective disorder ([place]) 01/29/2009  . Borderline personality disorder ([place]) 01/29/2009  . MILD MENTAL RETARDATION 01/29/2009  . ALLERGIC RHINITIS, SEASONAL 01/29/2009    Patient's Medications  New Prescriptions   No medications on file  Previous Medications   BENZTROPINE (COGENTIN) 1 MG TABLET    Take 1 mg by mouth every 6 (six) hours as needed for tremors.    CETIRIZINE (ZYRTEC) 10 MG TABLET    Take 10 mg by mouth daily.   CHOLECALCIFEROL (VITAMIN D3) 2000 UNITS TABS    Take 2,000 Units by mouth daily.   DIVALPROEX (DEPAKOTE) 250 MG DR TABLET    Take 250 mg by mouth daily.   DIVALPROEX (DEPAKOTE) 500 MG DR TABLET    Take 1,000 mg by mouth at bedtime. Taking two and one half tablets at night per FL-2 /tkk   DOCUSATE SODIUM (COLACE) 100 MG CAPSULE    Take 100 mg by mouth 3 (three) times daily.   HALOPERIDOL (HALDOL) 10 MG TABLET    Take 5 mg by mouth 2 (two) times daily as needed (anxiety).    HALOPERIDOL DECANOATE (HALDOL DECANOATE) 50 MG/ML INJECTION    Inject 150 mg into the muscle every 28 (twenty-eight) days.   HYDROXYZINE (VISTARIL) 25 MG CAPSULE    Take 25 mg by mouth at bedtime.    LITHIUM CARBONATE 300 MG CAPSULE    Take 300 mg by mouth 2 (two) times daily.   MEDROXYPROGESTERONE (DEPO-PROVERA) 150 MG/ML INJECTION    Inject 150 mg into the muscle every 3 (three) months.   NAPROXEN (NAPROSYN) 500 MG TABLET    Take 500 mg by mouth 2 (two) times daily with a meal.   OLANZAPINE (ZYPREXA) 20 MG TABLET    Take 20 mg by mouth at bedtime.   TRIHEXYPHENIDYL (ARTANE) 2 MG TABLET    Take 2 mg by mouth 3 (three) times daily with meals.   TRIUMEQ 600-50-300 MG TABLET     TAKE ONE TABLET BY MOUTH DAILY   VALBENAZINE TOSYLATE (INGREZZA) 40 MG CAPS    Take 40 mg by mouth daily.  Modified Medications   No medications on file  Discontinued Medications   No medications on file    Subjective: Karen Mcguire is in for her annual HIV follow-up visit.  She continues to reside at [facility] group home.  There are records indicate that she is being given Triumeq daily.  She has continued to lose weight.  She is accompanied by a med tech today who says that she eats well and he cannot understand why she is losing.  She says that she has not changed what she eats.  She says that she has had some abdominal pain and notes some blood when she goes to the bathroom.  She is not sure if she is passing blood from her rectum, vagina or bladder.  She says she did not tell her nurse practitioner because she felt like she would not be believed.  She has not received a COVID booster vaccine yet.  She says that she has been feeling more depressed and has been having more frequent outburst of anger.  She says that  she is very frustrated by not having normal activities due to the [place] pandemic.  Review of Systems: Review of Systems  Constitutional: Positive for weight loss. Negative for chills, diaphoresis, fever and malaise/fatigue.  Respiratory: Negative for cough and shortness of breath.   Cardiovascular: Negative for chest pain.  Gastrointestinal: Positive for abdominal pain and blood in stool. Negative for constipation, diarrhea, nausea and vomiting.  Genitourinary: Positive for hematuria.  Psychiatric/Behavioral: Positive for depression.    Past Medical History:  Diagnosis Date  . Bipolar disorder ([place])   . Hepatitis B carrier ([place])   . Hepatitis C without mention of hepatic coma   . History of traumatic head injury   . HIV infection ([place])   . MR (mental retardation), moderate   . Schizoaffective disorder ([place])     Social History   Tobacco Use  . Smoking status: Current  Every Day Smoker    Packs/day: 0.40    Years: 2.00    Pack years: 0.80    Types: Cigarettes  . Smokeless tobacco: Former Systems developer  . Tobacco comment: 7 cigs per day  Substance Use Topics  . Alcohol use: No    Alcohol/week: 0.0 standard drinks  . Drug use: No    Comment: used marijuana, pills, crack, heroin. Nothing since she has been at [facility] .    No family history on file.  Allergies  Allergen Reactions  . Penicillins Swelling    Health Maintenance  Topic Date Due  . COVID-19 Vaccine (1) Never done  . TETANUS/TDAP  Never done  . COLONOSCOPY (Pts 45-51yrs Insurance coverage will need to be confirmed)  Never done  . MAMMOGRAM  09/21/2017  . PAP SMEAR-Modifier  04/16/2020  . INFLUENZA VACCINE  02/21/2021  . Hepatitis C Screening  Completed  . HIV Screening  Completed  . HPV VACCINES  Aged Out    Objective:  Vitals:   11/23/20 1512  BP: 117/75  Pulse: (!) 106  Temp: 98 F (36.7 C)  Weight: 152 lb (68.9 kg)  Height: 5\' 1"  (1.549 m)   Body mass index is 28.72 kg/m.  Physical Exam Constitutional:      Comments: Her weight is down 64 pounds over the last 7 years but stable over the past year.  She now walks with the aid of a walker.  Cardiovascular:     Rate and Rhythm: Normal rate and regular rhythm.     Heart sounds: No murmur heard.   Pulmonary:     Effort: Pulmonary effort is normal.     Breath sounds: Normal breath sounds.  Abdominal:     Palpations: Abdomen is soft.     Tenderness: There is no abdominal tenderness.  Skin:    Findings: No rash.  Neurological:     Comments: She has a resting tremor of her left hand.  Psychiatric:        Mood and Affect: Mood normal.     Lab Results Lab Results  Component Value Date   WBC 7.9 10/30/2019   HGB 13.8 10/30/2019   HCT 40.4 10/30/2019   MCV 97.1 10/30/2019   PLT 270 10/30/2019    Lab Results  Component Value Date   CREATININE 0.73 10/30/2019   BUN 7 10/30/2019   NA 140 10/30/2019   K 4.5  10/30/2019   CL 110 10/30/2019   CO2 21 10/30/2019    Lab Results  Component Value Date   ALT 11 10/30/2019   AST 11 10/30/2019   ALKPHOS  49 03/14/2016   BILITOT 0.4 10/30/2019    Lab Results  Component Value Date   CHOL 83 10/30/2019   HDL 39 (L) 10/30/2019   LDLCALC 23 10/30/2019   TRIG 126 10/30/2019   CHOLHDL 2.1 10/30/2019   Lab Results  Component Value Date   LABRPR NON-REACTIVE 10/30/2019   HIV 1 RNA Quant (copies/mL)  Date Value  10/30/2019 65 (H)  07/15/2018 45 (H)  04/16/2017 119 (H)   CD4 T Cell Abs (/uL)  Date Value  10/30/2019 1,131  07/15/2018 1,340  04/16/2017 1,440     Problem List Items Addressed This Visit      High   Human immunodeficiency virus (HIV) disease ([place])    Her infection has been under very good long-term control due to being on directly observed therapy with Triumeq.  She will get lab work today, continue Triumeq and follow-up in 1 year.  Her [employer] says she is due to get her COVID booster soon.      Relevant Orders   T-helper cell (CD4)- ([facility] clinic only)   HIV-1 RNA quant-no reflex-bld   CBC   Comprehensive metabolic panel   RPR   Lipid panel     Unprioritized   Schizoaffective disorder ([place])    It sounds like the stress of the COVID pandemic and social distancing has caused some exacerbation of her chronic mental health condition.      Unintentional weight loss    Even though her weight is stable over the past year she is very concerned about the weight loss over the past several years.  I encouraged her to discuss the bleeding that she is noted when going to the bathroom with her nurse practitioner.           Michel Bickers, MD [facility] [phone] pager   [phone] cell 11/23/2020, 3:35 PM

## 2020-11-24 LAB — T-HELPER CELL (CD4) - (RCID CLINIC ONLY)
CD4 % Helper T Cell: 57 % (ref 33–65)
CD4 T Cell Abs: 1138 /uL (ref 400–1790)

## 2020-11-25 LAB — CBC
HCT: 39.5 % (ref 35.0–45.0)
Hemoglobin: 13.4 g/dL (ref 11.7–15.5)
MCH: 32.9 pg (ref 27.0–33.0)
MCHC: 33.9 g/dL (ref 32.0–36.0)
MCV: 97.1 fL (ref 80.0–100.0)
MPV: 9.6 fL (ref 7.5–12.5)
Platelets: 283 10*3/uL (ref 140–400)
RBC: 4.07 10*6/uL (ref 3.80–5.10)
RDW: 14.4 % (ref 11.0–15.0)
WBC: 10.2 10*3/uL (ref 3.8–10.8)

## 2020-11-25 LAB — COMPREHENSIVE METABOLIC PANEL
AG Ratio: 1.3 (calc) (ref 1.0–2.5)
ALT: 8 U/L (ref 6–29)
AST: 13 U/L (ref 10–35)
Albumin: 4 g/dL (ref 3.6–5.1)
Alkaline phosphatase (APISO): 57 U/L (ref 37–153)
BUN: 16 mg/dL (ref 7–25)
CO2: 22 mmol/L (ref 20–32)
Calcium: 9.6 mg/dL (ref 8.6–10.4)
Chloride: 106 mmol/L (ref 98–110)
Creat: 0.98 mg/dL (ref 0.50–1.05)
Globulin: 3.1 g/dL (calc) (ref 1.9–3.7)
Glucose, Bld: 93 mg/dL (ref 65–99)
Potassium: 4.3 mmol/L (ref 3.5–5.3)
Sodium: 137 mmol/L (ref 135–146)
Total Bilirubin: 0.3 mg/dL (ref 0.2–1.2)
Total Protein: 7.1 g/dL (ref 6.1–8.1)

## 2020-11-25 LAB — LIPID PANEL
Cholesterol: 95 mg/dL (ref <200)
HDL: 39 mg/dL — ABNORMAL LOW (ref >=50)
LDL Cholesterol (Calc): 33 mg/dL (calc)
Non-HDL Cholesterol (Calc): 56 mg/dL (calc) (ref <130)
Total CHOL/HDL Ratio: 2.4 (calc) (ref <5.0)
Triglycerides: 150 mg/dL — ABNORMAL HIGH (ref <150)

## 2020-11-25 LAB — HIV-1 RNA QUANT-NO REFLEX-BLD
HIV 1 RNA Quant: 56 Copies/mL — ABNORMAL HIGH
HIV-1 RNA Quant, Log: 1.75 Log cps/mL — ABNORMAL HIGH

## 2020-11-25 LAB — RPR: RPR Ser Ql: NONREACTIVE

## 2021-09-14 ENCOUNTER — Other Ambulatory Visit: Payer: Self-pay

## 2021-09-14 ENCOUNTER — Encounter: Payer: Self-pay | Admitting: Internal Medicine

## 2021-09-14 ENCOUNTER — Ambulatory Visit (INDEPENDENT_AMBULATORY_CARE_PROVIDER_SITE_OTHER): Payer: Medicaid Other | Admitting: Internal Medicine

## 2021-09-14 DIAGNOSIS — B20 Human immunodeficiency virus [HIV] disease: Secondary | ICD-10-CM

## 2021-09-14 NOTE — Progress Notes (Signed)
Patient Active Problem List   Diagnosis Date Noted   Human immunodeficiency virus (HIV) disease ([place]) 01/29/2009    Priority: High   Unintentional weight loss 11/23/2020   Poor dentition 11/27/2019   Polysubstance abuse ([place]) 10/22/2013   Skull fracture ([place]) 10/22/2013   Dyslipidemia 10/22/2013   Schizoaffective disorder ([place]) 01/29/2009   Borderline personality disorder ([place]) 01/29/2009   MILD MENTAL RETARDATION 01/29/2009   ALLERGIC RHINITIS, SEASONAL 01/29/2009    Patient's Medications  New Prescriptions   No medications on file  Previous Medications   BENZTROPINE (COGENTIN) 1 MG TABLET    Take 1 mg by mouth every 6 (six) hours as needed for tremors.    CETIRIZINE (ZYRTEC) 10 MG TABLET    Take 10 mg by mouth daily.   CHOLECALCIFEROL (VITAMIN D3) 2000 UNITS TABS    Take 2,000 Units by mouth daily.   DIVALPROEX (DEPAKOTE) 250 MG DR TABLET    Take 250 mg by mouth daily.   DIVALPROEX (DEPAKOTE) 500 MG DR TABLET    Take 1,000 mg by mouth at bedtime. Taking two and one half tablets at night per FL-2 /tkk   DOCUSATE SODIUM (COLACE) 100 MG CAPSULE    Take 100 mg by mouth 3 (three) times daily.   FERROUS SULFATE 324 MG TBEC    Take 324 mg by mouth.   FLUDROCORTISONE ACETATE PO    Take by mouth.   FOLIC ACID (FOLVITE) 211 MCG TABLET    Take 400 mcg by mouth daily.   HALOPERIDOL (HALDOL) 10 MG TABLET    Take 5 mg by mouth 2 (two) times daily as needed (anxiety).    HALOPERIDOL DECANOATE (HALDOL DECANOATE) 50 MG/ML INJECTION    Inject 150 mg into the muscle every 28 (twenty-eight) days.   HYDROXYZINE (VISTARIL) 25 MG CAPSULE    Take 25 mg by mouth at bedtime.    LITHIUM CARBONATE 300 MG CAPSULE    Take 300 mg by mouth 2 (two) times daily.   MEDROXYPROGESTERONE (DEPO-PROVERA) 150 MG/ML INJECTION    Inject 150 mg into the muscle every 3 (three) months.   MULTIPLE VITAMIN (MULTIVITAMIN ADULT PO)    Take by mouth.   NAPROXEN (NAPROSYN) 500 MG TABLET    Take 500 mg by mouth 2  (two) times daily with a meal.   OLANZAPINE (ZYPREXA) 20 MG TABLET    Take 20 mg by mouth at bedtime.   PANTOPRAZOLE (PROTONIX) 40 MG TABLET    Take 40 mg by mouth daily.   TRIHEXYPHENIDYL (ARTANE) 2 MG TABLET    Take 2 mg by mouth 3 (three) times daily with meals.   TRIUMEQ 600-50-300 MG TABLET    TAKE ONE TABLET BY MOUTH DAILY   VALBENAZINE TOSYLATE (INGREZZA) 40 MG CAPS    Take 40 mg by mouth daily.  Modified Medications   No medications on file  Discontinued Medications   No medications on file    Subjective: Karen Mcguire is in for her routine HIV follow-up visit.  She is accompanied by an aide from the [facility] where she is a longtime resident.  Karen Mcguire was admitted to [facility] last September with a large cervical epidural abscess and quadriplegia.  She underwent neurosurgery with incision and drainage of the abscess and spinal fusion.  Cultures grew strep.  She was discharged on IV vancomycin, ceftriaxone and metronidazole and followed up in the ID clinic at [facility].  At the time of her admission her  CD4 count was 868 and her viral load was undetectable at less than 40.  She has continued on Triumeq.  The paperwork that came with her today from the [facility] also lists Tivicay as one of her medications.  No of any reason why she would be on this.  Apparently she has been having some intermittent nausea, vomiting and diarrhea.  She is being treated for a sacral pressure sore.  Her most recent wound care note from 1 week ago did not mention any concerns about infection of the wound.   Review of Systems: Review of Systems  Constitutional:  Negative for fever.  Gastrointestinal:  Positive for diarrhea, nausea and vomiting.  Neurological:  Positive for focal weakness.   Past Medical History:  Diagnosis Date   Bipolar disorder ([place])    Hepatitis B carrier ([place])    Hepatitis C without mention of hepatic coma    History of traumatic head injury    HIV infection ([place])    MR (mental  retardation), moderate    Schizoaffective disorder ([place])     Social History   Tobacco Use   Smoking status: Former    Packs/day: 0.40    Years: 2.00    Pack years: 0.80    Types: Cigarettes   Smokeless tobacco: Former   Tobacco comments:    States she quit a long time ago  Substance Use Topics   Alcohol use: No    Alcohol/week: 0.0 standard drinks   Drug use: No    Comment: used marijuana, pills, crack, heroin. Nothing since she has been at [facility] .    No family history on file.  Allergies  Allergen Reactions   Penicillins Swelling    Health Maintenance  Topic Date Due   COVID-19 Vaccine (1) Never done   Zoster Vaccines- Shingrix (1 of 2) Never done   COLONOSCOPY (Pts 45-26yrs Insurance coverage will need to be confirmed)  Never done   MAMMOGRAM  09/21/2017   TETANUS/TDAP  10/16/2017   PAP SMEAR-Modifier  04/16/2020   INFLUENZA VACCINE  02/21/2021   Hepatitis C Screening  Completed   HIV Screening  Completed   HPV VACCINES  Aged Out    Objective:  Vitals:   09/14/21 0913  BP: 107/72  Pulse: 99  Temp: 98.1 F (36.7 C)  SpO2: 100%  Weight: 139 lb (63 kg)  Height: 5' (1.524 m)   Body mass index is 27.15 kg/m.  Physical Exam Constitutional:      Comments: She is seated in a wheelchair.  She is very talkative.  Neck:     Comments: She has a healed anterior neck incision. Cardiovascular:     Rate and Rhythm: Normal rate.  Pulmonary:     Effort: Pulmonary effort is normal.  Neurological:     Motor: Weakness present.    Lab Results Lab Results  Component Value Date   WBC 10.2 11/23/2020   HGB 13.4 11/23/2020   HCT 39.5 11/23/2020   MCV 97.1 11/23/2020   PLT 283 11/23/2020    Lab Results  Component Value Date   CREATININE 0.98 11/23/2020   BUN 16 11/23/2020   NA 137 11/23/2020   K 4.3 11/23/2020   CL 106 11/23/2020   CO2 22 11/23/2020    Lab Results  Component Value Date   ALT 8 11/23/2020   AST 13 11/23/2020   ALKPHOS 49  03/14/2016   BILITOT 0.3 11/23/2020    Lab Results  Component Value Date  CHOL 95 11/23/2020   HDL 39 (L) 11/23/2020   LDLCALC 33 11/23/2020   TRIG 150 (H) 11/23/2020   CHOLHDL 2.4 11/23/2020   Lab Results  Component Value Date   LABRPR NON-REACTIVE 11/23/2020   HIV 1 RNA Quant  Date Value  11/23/2020 56 Copies/mL (H)  10/30/2019 65 copies/mL (H)  07/15/2018 45 copies/mL (H)   CD4 T Cell Abs (/uL)  Date Value  11/23/2020 1,138  10/30/2019 1,131  07/15/2018 1,340     Problem List Items Addressed This Visit       High   Human immunodeficiency virus (HIV) disease ([place])    Her infection has been under excellent, long-term control with directly observed Triumeq therapy.  She has no history of drug-resistant HIV.  I do not know of any reason why she also needs to be on Tivicay.  I would recommend continuing Triumeq alone.  She will get repeat lab work today and follow-up here in 1 year.      Relevant Orders   T-helper cells (CD4) count (not at [facility])   HIV-1 RNA quant-no reflex-bld      Michel Bickers, MD [facility] [place] [phone] pager   [phone] cell 09/14/2021, 9:44 AM

## 2021-09-14 NOTE — Assessment & Plan Note (Signed)
Her infection has been under excellent, long-term control with directly observed Triumeq therapy.  She has no history of drug-resistant HIV.  I do not know of any reason why she also needs to be on Tivicay.  I would recommend continuing Triumeq alone.  She will get repeat lab work today and follow-up here in 1 year.

## 2021-09-16 LAB — T-HELPER CELLS (CD4) COUNT (NOT AT ARMC)
Absolute CD4: 891 cells/uL (ref 490–1740)
CD4 T Helper %: 54 % (ref 30–61)
Total lymphocyte count: 1639 cells/uL (ref 850–3900)

## 2021-09-16 LAB — HIV-1 RNA QUANT-NO REFLEX-BLD
HIV 1 RNA Quant: 54 Copies/mL — ABNORMAL HIGH
HIV-1 RNA Quant, Log: 1.73 Log cps/mL — ABNORMAL HIGH

## 2021-09-29 ENCOUNTER — Telehealth: Payer: Self-pay

## 2021-09-29 NOTE — Telephone Encounter (Signed)
I attempted to contact the facility for the patient to discuss the appointment for the patient next week. I was unable to reach anyone and left a message for [facility] to call back. Per Luanna Cole (front desk) she stated the facility called stating the patient needed to be seen for a wound. I will attempt to contact them again to follow up on what the patient needs.  ?Charletta Voight T Estalene Bergey ? ?

## 2021-09-30 NOTE — Telephone Encounter (Addendum)
I spoke with [place] with [facility] [phone]) regarding patient's upcoming appointment with our office. Per [place] the wound care doctor at the facility feels the patient sacral wound is not improving and would like this to be evaluated by our office. I have requested that [place] fax updated notes from the wound care doctor, updated labs and culture results. I provided [place] with our fax number as well. ? ?I have also reschedule patient to a 30 min appointment with Dr. Megan Salon  on 10/06/21 '@10'$ :00 am and I am waiting for Amy with the rehab facility transportation team to call be back, so I can give her patient's new appointment date and time. ?Karen Mcguire, CMA  ?

## 2021-10-04 ENCOUNTER — Ambulatory Visit: Payer: Medicaid Other | Admitting: Internal Medicine

## 2021-10-06 ENCOUNTER — Ambulatory Visit: Payer: Medicaid Other | Admitting: Internal Medicine

## 2021-10-20 ENCOUNTER — Other Ambulatory Visit: Payer: Self-pay

## 2021-10-20 ENCOUNTER — Encounter: Payer: Self-pay | Admitting: Internal Medicine

## 2021-10-20 ENCOUNTER — Ambulatory Visit (INDEPENDENT_AMBULATORY_CARE_PROVIDER_SITE_OTHER): Payer: Medicaid Other | Admitting: Internal Medicine

## 2021-10-20 DIAGNOSIS — L89159 Pressure ulcer of sacral region, unspecified stage: Secondary | ICD-10-CM | POA: Insufficient documentation

## 2021-10-20 DIAGNOSIS — G061 Intraspinal abscess and granuloma: Secondary | ICD-10-CM

## 2021-10-20 DIAGNOSIS — L89154 Pressure ulcer of sacral region, stage 4: Secondary | ICD-10-CM | POA: Diagnosis not present

## 2021-10-20 DIAGNOSIS — G8250 Quadriplegia, unspecified: Secondary | ICD-10-CM

## 2021-10-20 NOTE — Progress Notes (Addendum)
Patient Active Problem List   Diagnosis Date Noted   Sacral decubitus ulcer 10/20/2021    Priority: High   Human immunodeficiency virus (HIV) disease (HCC) 01/29/2009    Priority: High   Abscess in epidural space of cervical spine 10/20/2021    Priority: Medium    Quadriplegia (HCC) 10/20/2021    Priority: Medium    Unintentional weight loss 11/23/2020    Priority: Medium    Poor dentition 11/27/2019   Polysubstance abuse (HCC) 10/22/2013   Skull fracture (HCC) 10/22/2013   Dyslipidemia 10/22/2013   Schizoaffective disorder (HCC) 01/29/2009   Borderline personality disorder (HCC) 01/29/2009   MILD MENTAL RETARDATION 01/29/2009   ALLERGIC RHINITIS, SEASONAL 01/29/2009    Patient's Medications  New Prescriptions   No medications on file  Previous Medications   ACETAMINOPHEN (TYLENOL) 325 MG TABLET    Take 650 mg by mouth every 6 (six) hours as needed.   BENZTROPINE (COGENTIN) 1 MG TABLET    Take 1 mg by mouth every 6 (six) hours as needed for tremors.   CEFEPIME (MAXIPIME) IVPB    Inject 2 g into the vein daily.   CETIRIZINE (ZYRTEC) 10 MG TABLET    Take 10 mg by mouth daily.   CHOLECALCIFEROL (VITAMIN D3) 2000 UNITS TABS    Take 2,000 Units by mouth daily.   DIVALPROEX (DEPAKOTE) 250 MG DR TABLET    Take 250 mg by mouth daily.   DIVALPROEX (DEPAKOTE) 500 MG DR TABLET    Take 1,000 mg by mouth at bedtime. Taking two and one half tablets at night per FL-2 /tkk   DOCUSATE SODIUM (COLACE) 100 MG CAPSULE    Take 100 mg by mouth 3 (three) times daily.   FERROUS SULFATE 324 MG TBEC    Take 324 mg by mouth.   FLUDROCORTISONE ACETATE PO    Take by mouth.   FOLIC ACID (FOLVITE) 400 MCG TABLET    Take 400 mcg by mouth daily.   HALOPERIDOL (HALDOL) 10 MG TABLET    Take 5 mg by mouth 2 (two) times daily as needed (anxiety).   HALOPERIDOL DECANOATE (HALDOL DECANOATE) 50 MG/ML INJECTION    Inject 150 mg into the muscle every 28 (twenty-eight) days.   HYDROXYZINE (VISTARIL) 25  MG CAPSULE    Take 25 mg by mouth at bedtime.   LITHIUM CARBONATE 300 MG CAPSULE    Take 300 mg by mouth 2 (two) times daily.   MEDROXYPROGESTERONE (DEPO-PROVERA) 150 MG/ML INJECTION    Inject 150 mg into the muscle every 3 (three) months.   MIDODRINE HCL PO    Take 5 mg by mouth.   MULTIPLE VITAMIN (MULTIVITAMIN ADULT PO)    Take by mouth.   NAPROXEN (NAPROSYN) 500 MG TABLET    Take 500 mg by mouth 2 (two) times daily with a meal.   OLANZAPINE (ZYPREXA) 20 MG TABLET    Take 20 mg by mouth at bedtime.   PANTOPRAZOLE (PROTONIX) 40 MG TABLET    Take 40 mg by mouth daily.   POTASSIUM CHLORIDE CRYS ER (KLOR-CON M20 PO)    Take by mouth.   SERTRALINE (ZOLOFT) 100 MG TABLET    Take 100 mg by mouth daily.   THIAMINE HCL (THIAMINE PO)    Take by mouth.   TRIHEXYPHENIDYL (ARTANE) 2 MG TABLET    Take 2 mg by mouth 3 (three) times daily with meals.   TRIUMEQ 600-50-300 MG TABLET  TAKE ONE TABLET BY MOUTH DAILY   VALBENAZINE TOSYLATE (INGREZZA) 40 MG CAPS    Take 40 mg by mouth daily.  Modified Medications   No medications on file  Discontinued Medications   No medications on file    Subjective: Daly is seen for a working visit.  She was here last month and has routine follow-up for her HIV infection.  She remains on directly observed therapy with Triumeq at her skilled nursing facility.  Her recent CD4 count was 891 and her viral load was low and stable at 54.  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  She was treated with a long course of IV antibiotics but left with quadriplegia.  She has lost about 20 pounds over the past year.  She has been bedridden and has developed a large sacral decubitus ulcer.  A swab culture of her wound on 09/21/2021 showed gram-positive rods and gram-positive cocci on stain.  Cultures grew MRSA and greater than 2 gram-negative rods.  There was no evidence of osteomyelitis noted on a plain x-ray.  A wound  care note on 09/28/2021 noted that the wound was deeper and that she was not following offloading recommendations.  She was started on vancomycin and cefepime.  She has not been having any problems tolerating her PICC or cefepime.  She tells me that the wound care nurse has told her that the wound is looking worse.  Review of Systems: Review of Systems  Constitutional:  Negative for fever.  Gastrointestinal:  Negative for abdominal pain, diarrhea, nausea and vomiting.   Past Medical History:  Diagnosis Date   Bipolar disorder (HCC)    Hepatitis B carrier (HCC)    Hepatitis C without mention of hepatic coma    History of traumatic head injury    HIV infection (HCC)    MR (mental retardation), moderate    Schizoaffective disorder (HCC)     Social History   Tobacco Use   Smoking status: Former    Packs/day: 0.40    Years: 2.00    Pack years: 0.80    Types: Cigarettes   Smokeless tobacco: Former   Tobacco comments:    States she quit a long time ago  Substance Use Topics   Alcohol use: No    Alcohol/week: 0.0 standard drinks   Drug use: No    Comment: used marijuana, pills, crack, heroin. Nothing since she has been at [facility] .    No family history on file.  Allergies  Allergen Reactions   Penicillins Swelling    Health Maintenance  Topic Date Due   COVID-19 Vaccine (1) Never done   Zoster Vaccines- Shingrix (1 of 2) Never done   COLONOSCOPY (Pts 45-20yrs Insurance coverage will need to be confirmed)  Never done   MAMMOGRAM  09/21/2017   TETANUS/TDAP  10/16/2017   PAP SMEAR-Modifier  04/16/2020   INFLUENZA VACCINE  02/21/2021   Hepatitis C Screening  Completed   HIV Screening  Completed   HPV VACCINES  Aged Out    Objective:  Vitals:   10/20/21 0937  BP: 112/75  Pulse: (!) 103  Temp: (!) 97.5 F (36.4 C)  TempSrc: Temporal   There is no height or weight on file to calculate BMI.  Physical Exam Constitutional:      Comments: She has pleasant and in  no distress.  Cardiovascular:     Rate and Rhythm: Normal rate.  Pulmonary:  Effort: Pulmonary effort is normal.  Musculoskeletal:     Comments: She has a very large, very deep sacral wound with exposed bone.  There is no surrounding erythema or fluctuance.  There is no malodor.     Lab Results Lab Results  Component Value Date   WBC 10.2 11/23/2020   HGB 13.4 11/23/2020   HCT 39.5 11/23/2020   MCV 97.1 11/23/2020   PLT 283 11/23/2020    Lab Results  Component Value Date   CREATININE 0.98 11/23/2020   BUN 16 11/23/2020   NA 137 11/23/2020   K 4.3 11/23/2020   CL 106 11/23/2020   CO2 22 11/23/2020    Lab Results  Component Value Date   ALT 8 11/23/2020   AST 13 11/23/2020   ALKPHOS 49 03/14/2016   BILITOT 0.3 11/23/2020    Lab Results  Component Value Date   CHOL 95 11/23/2020   HDL 39 (L) 11/23/2020   LDLCALC 33 11/23/2020   TRIG 150 (H) 11/23/2020   CHOLHDL 2.4 11/23/2020   Lab Results  Component Value Date   LABRPR NON-REACTIVE 11/23/2020   HIV 1 RNA Quant  Date Value  09/14/2021 54 Copies/mL (H)  11/23/2020 56 Copies/mL (H)  10/30/2019 65 copies/mL (H)   CD4 T Cell Abs (/uL)  Date Value  11/23/2020 1,138  10/30/2019 1,131  07/15/2018 1,340     Problem List Items Addressed This Visit       High   Sacral decubitus ulcer    She is going to have a great deal of difficulty healing this very large wound given recent unintentional weight loss, difficulty complying with pressure offloading measures and possible soiling with stool.  The gram-negative rods grown from the swab culture probably represent a combination of enteric's and anaerobes.  I would recommend continuing vancomycin but changing cefepime to ertapenem to improve anaerobic coverage.  I would recommend treating for at least 3 more weeks through 11/11/2021.  Plan: Continue vancomycin Change cefepime to IV ertapenem 1 g daily Continue antibiotics through 11/11/2021 Continue efforts to  offload pressure Continue Triumeq Follow-up here in 1 month        Medium    Abscess in epidural space of cervical spine   Quadriplegia (HCC)      Cliffton Asters, MD [facility] [phone] pager   [phone] cell 10/20/2021, 10:10 AM

## 2021-10-20 NOTE — Assessment & Plan Note (Addendum)
She is going to have a great deal of difficulty healing this very large wound given recent unintentional weight loss, difficulty complying with pressure offloading measures and possible soiling with stool.  The gram-negative rods grown from the swab culture probably represent a combination of enteric's and anaerobes.  I would recommend continuing vancomycin but changing cefepime to ertapenem to improve anaerobic coverage.  I would recommend treating for at least 3 more weeks through 11/11/2021. ? ?Plan: ?Continue vancomycin ?Change cefepime to IV ertapenem 1 g daily ?Continue antibiotics through 11/11/2021 ?Continue efforts to offload pressure ?Continue Triumeq ?Follow-up here in 1 month ?

## 2021-11-07 ENCOUNTER — Telehealth: Payer: Self-pay

## 2021-11-07 ENCOUNTER — Other Ambulatory Visit: Payer: Self-pay

## 2021-11-07 ENCOUNTER — Encounter (HOSPITAL_COMMUNITY): Payer: Self-pay

## 2021-11-07 ENCOUNTER — Emergency Department (HOSPITAL_COMMUNITY)
Admission: EM | Admit: 2021-11-07 | Discharge: 2021-11-08 | Disposition: A | Discharge status: Home / Self Care | Payer: Medicaid Other | Attending: Emergency Medicine | Admitting: Emergency Medicine

## 2021-11-07 DIAGNOSIS — Z79899 Other long term (current) drug therapy: Secondary | ICD-10-CM | POA: Insufficient documentation

## 2021-11-07 DIAGNOSIS — Z452 Encounter for adjustment and management of vascular access device: Secondary | ICD-10-CM | POA: Insufficient documentation

## 2021-11-07 DIAGNOSIS — Z789 Other specified health status: Secondary | ICD-10-CM

## 2021-11-07 NOTE — ED Triage Notes (Signed)
Patient coming from [place] and rehab for PICC line clogged. Patient receiving IV antibiotics through PICC line and nurse was unable to flush PICC line. Patient altered at baseline.  ?

## 2021-11-07 NOTE — ED Notes (Signed)
Patient has existing PICC line in Right brachial and the line will not flush; cap changed and will not flush ?

## 2021-11-07 NOTE — ED Provider Notes (Signed)
?[facility] ?Provider Note ? ? ?CSN: [id number] ?Arrival date & time: 11/07/21  1952 ? ?  ? ?History ? ?Chief Complaint  ?Patient presents with  ? Vascular Access Problem  ? ? ?Karen Mcguire is a 56 y.o. female. ? ?Patient sent from [facility] and rehab for PICC line being clogged.  Patient is receiving IV antibiotics through the PICC line and nurse was unable to flush the line.  Patient's mental status is baseline.  Patient followed by infectious disease.  Patient receives Maxipen IV 2 g daily. ? ?Past medical history is significant for mental retardation schizoaffective disorder bipolar disorder hepatitis B carrier hepatitis C HIV infection history of traumatic head injury.  Chart review shows ? ?She is seen by Michel Bickers infectious disease.  Last seen March 30.  Patient has a sacral decubitus ulcer.  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  History of polysubstance abuse.  Patient also has fairly significant sacral decubitus ulcer.  Supposedly the epidural abscess with Streptococcus.  She was treated with long course of IV antibiotics but left with quadriplegia.  She is bedridden and developed a large sacral decubitus ulcer swab culture of her wound on March 1 showed gram-positive rods and gram-positive cocci.  Cultures grew MRSA and greater than 2 gram-negative rods.  There was evidence of osteomyelitis noted on plain x-ray wound care noted on March 8 that the wound was deeper.  She was started on vancomycin and cefepime.  She has not had any troubles tolerating her PICC line on the cefepime. ? ?  ? ?Home Medications ?Prior to Admission medications   ?Medication Sig Start Date End Date Taking? Authorizing Provider  ?acetaminophen (TYLENOL) 325 MG tablet Take 650 mg by mouth every 6 (six) hours as needed.    [provider]  ?benztropine (COGENTIN) 1 MG tablet Take 1 mg by mouth every 6 (six) hours as needed for tremors.    [provider]   ?ceFEPime (MAXIPIME) IVPB Inject 2 g into the vein daily.    [provider]  ?cetirizine (ZYRTEC) 10 MG tablet Take 10 mg by mouth daily.    [provider]  ?Cholecalciferol (VITAMIN D3) 2000 UNITS TABS Take 2,000 Units by mouth daily.    [provider]  ?divalproex (DEPAKOTE) 250 MG DR tablet Take 250 mg by mouth daily.    [provider]  ?divalproex (DEPAKOTE) 500 MG DR tablet Take 1,000 mg by mouth at bedtime. Taking two and one half tablets at night per FL-2 /tkk    [provider]  ?docusate sodium (COLACE) 100 MG capsule Take 100 mg by mouth 3 (three) times daily.    [provider]  ?ferrous sulfate 324 MG TBEC Take 324 mg by mouth.    [provider]  ?FLUDROCORTISONE ACETATE PO Take by mouth.    [provider]  ?folic acid (FOLVITE) 518 MCG tablet Take 400 mcg by mouth daily.    [provider]  ?haloperidol (HALDOL) 10 MG tablet Take 5 mg by mouth 2 (two) times daily as needed (anxiety).    [provider]  ?haloperidol decanoate (HALDOL DECANOATE) 50 MG/ML injection Inject 150 mg into the muscle every 28 (twenty-eight) days.    [provider]  ?hydrOXYzine (VISTARIL) 25 MG capsule Take 25 mg by mouth at bedtime.    [provider]  ?lithium carbonate 300 MG capsule Take 300 mg by mouth 2 (two) times daily.    [provider]  ?medroxyPROGESTERone (DEPO-PROVERA) 150 MG/ML injection Inject 150 mg into the muscle every 3 (three) months.    [provider]  ?MIDODRINE HCL PO Take 5 mg by mouth.    [provider]  ?Multiple Vitamin (MULTIVITAMIN ADULT PO) Take by mouth.    [provider]  ?naproxen (NAPROSYN) 500 MG tablet Take 500 mg by mouth 2 (two) times daily with a meal.    [provider]  ?OLANZapine (ZYPREXA) 20 MG tablet Take 20 mg by mouth at bedtime.    [provider]  ?pantoprazole (PROTONIX) 40 MG tablet Take 40 mg by mouth  daily.    [provider]  ?Potassium Chloride Crys ER (KLOR-CON M20 PO) Take by mouth.    [provider]  ?sertraline (ZOLOFT) 100 MG tablet Take 100 mg by mouth daily.    [provider]  ?Thiamine HCl (THIAMINE PO) Take by mouth.    [provider]  ?trihexyphenidyl (ARTANE) 2 MG tablet Take 2 mg by mouth 3 (three) times daily with meals.    [provider]  ?TRIUMEQ 600-50-300 MG tablet TAKE ONE TABLET BY MOUTH DAILY 08/09/18   Michel Bickers, MD  ?[facility] Tosylate [facility]) 40 MG CAPS Take 40 mg by mouth daily.    [provider]  ?   ? ?Allergies    ?Penicillins   ? ?Review of Systems   ?Review of Systems  ?Unable to perform ROS: Psychiatric disorder  ? ?Physical Exam ?Updated Vital Signs ?BP 101/68 (BP Location: Left Arm)   Pulse 100   Temp 99.5 ?F (37.5 ?C)   Resp 18   Ht 1.524 m (5')   Wt 63 kg   SpO2 98%   BMI 27.13 kg/m?  ?Physical Exam ?Vitals and nursing note reviewed.  ?Constitutional:   ?   General: She is not in acute distress. ?   Appearance: She is well-developed. She is not ill-appearing.  ?HENT:  ?   Head: Normocephalic and atraumatic.  ?Eyes:  ?   Conjunctiva/sclera: Conjunctivae normal.  ?Cardiovascular:  ?   Rate and Rhythm: Normal rate and regular rhythm.  ?   Heart sounds: No murmur heard. ?Pulmonary:  ?   Effort: Pulmonary effort is normal. No respiratory distress.  ?   Breath sounds: Normal breath sounds.  ?Abdominal:  ?   Palpations: Abdomen is soft.  ?   Tenderness: There is no abdominal tenderness.  ?Musculoskeletal:     ?   General: No swelling.  ?   Cervical back: Neck supple.  ?   Comments: Deep sacral wound dressing changed by nursing.  Unable to judge how different this may be.  PICC line in the right brachial area.  ?Skin: ?   General: Skin is warm and dry.  ?   Capillary Refill: Capillary refill takes less than 2 seconds.  ?Neurological:  ?   Mental Status: She is alert. Mental status is at baseline.  ?   Comments:  Patient has some movement of her upper extremities.  Quadriplegia.  ?Psychiatric:     ?   Mood and Affect: Mood normal.  ? ? ?ED Results / Procedures / Treatments   ?Labs ?(all labs ordered are listed, but only abnormal results are displayed) ?Labs Reviewed - No data to display ? ?EKG ?None ? ?Radiology ?No results found. ? ?Procedures ?Procedures  ? ? ?Medications Ordered in ED ?Medications - No data to display ? ?ED Course/ Medical Decision Making/ A&P ?  ?                        ?  Medical Decision Making ? ?Nursing tried to flush the PICC line.  It will not work.  We have no resources here to start a PICC line here this evening.  We will establish an IV so that they can give her Maxipen.  And then they can make arrangements to have PICC line redone. ? ?Patient stable to return back to rehab facility. ? ? ?Final Clinical Impression(s) / ED Diagnoses ?Final diagnoses:  ?Problem with vascular access  ? ? ?Rx / DC Orders ?ED Discharge Orders   ? ? None  ? ?  ? ? ?  ?Fredia Sorrow, MD ?11/07/21 2327 ? ?

## 2021-11-07 NOTE — Telephone Encounter (Signed)
Received a  call from nurse Lenord Carbo at [facility]. She stated that the pt midline is occluded and will need to be replaced. They contacted the company mobile x to replace midline and they are not sure when they will arrive at facility to replace it and put a hold on administering IV medication until it's replaced. [facility] will need an order from Dr. Megan Salon to hold IV medication until the midline is replaced.  ? ?Contact information nurse Lenord Carbo is phone: [phone] for [address]/ fax # [phone] and  she is there until 3 pm today. ?

## 2021-11-07 NOTE — Discharge Instructions (Signed)
PICC line is clotted not able to get it working here.  There is no PICC line access here at night.  Peripheral IV started so that antibiotics could be given peripherally temporarily.  Patient stable for discharge back to nursing facility. ?

## 2021-11-07 NOTE — ED Notes (Signed)
Patient had small bowel movement. Patient changed and provided with new wound care, sacral dressing and brief. Wet-to-dry dressing. Patient adjusted in bed with pillow placed between legs and under back.Patient has stage 3 pressure ulcer to sacrum.  ?

## 2021-11-07 NOTE — ED Notes (Signed)
Patient given 4 blankets, sandwich, chips, and grape juice. Patient ow complains that she is too hot.  ?

## 2021-11-07 NOTE — ED Notes (Signed)
Patient denies pain and is resting comfortably.  

## 2021-11-07 NOTE — ED Notes (Signed)
[facility] notified of patient needing transportation back to facility. ?

## 2021-11-07 NOTE — Telephone Encounter (Signed)
Spoke with Nira Conn at [facility] to relay verbal orders relayed below and  she stated that the pt was sent to the ER for picc replacement. ?

## 2021-11-08 ENCOUNTER — Other Ambulatory Visit: Payer: Self-pay

## 2021-11-08 MED ORDER — OXYCODONE-ACETAMINOPHEN 5-325 MG PO TABS
1.0000 | ORAL_TABLET | Freq: Once | ORAL | Status: AC
  Administered 2021-11-08: 1 via ORAL
  Filled 2021-11-08: qty 1

## 2021-11-08 NOTE — ED Notes (Signed)
Pt repositioned

## 2021-11-21 ENCOUNTER — Emergency Department (HOSPITAL_COMMUNITY): Payer: Medicaid Other

## 2021-11-21 ENCOUNTER — Emergency Department (HOSPITAL_COMMUNITY)
Admission: EM | Admit: 2021-11-21 | Discharge: 2021-11-22 | Disposition: A | Discharge status: Skilled Nursing Facility | Payer: Medicaid Other | Attending: Emergency Medicine | Admitting: Emergency Medicine

## 2021-11-21 ENCOUNTER — Encounter (HOSPITAL_COMMUNITY): Payer: Self-pay

## 2021-11-21 ENCOUNTER — Other Ambulatory Visit: Payer: Self-pay

## 2021-11-21 DIAGNOSIS — L89159 Pressure ulcer of sacral region, unspecified stage: Secondary | ICD-10-CM | POA: Diagnosis not present

## 2021-11-21 DIAGNOSIS — Z21 Asymptomatic human immunodeficiency virus [HIV] infection status: Secondary | ICD-10-CM | POA: Insufficient documentation

## 2021-11-21 DIAGNOSIS — Z593 Problems related to living in residential institution: Secondary | ICD-10-CM

## 2021-11-21 DIAGNOSIS — R531 Weakness: Secondary | ICD-10-CM | POA: Diagnosis not present

## 2021-11-21 LAB — CBC WITH DIFFERENTIAL/PLATELET
Abs Immature Granulocytes: 0.03 10*3/uL (ref 0.00–0.07)
Basophils Absolute: 0.1 10*3/uL (ref 0.0–0.1)
Basophils Relative: 1 %
Eosinophils Absolute: 0.4 10*3/uL (ref 0.0–0.5)
Eosinophils Relative: 5 %
HCT: 28.6 % — ABNORMAL LOW (ref 36.0–46.0)
Hemoglobin: 9.3 g/dL — ABNORMAL LOW (ref 12.0–15.0)
Immature Granulocytes: 0 %
Lymphocytes Relative: 21 %
Lymphs Abs: 1.6 10*3/uL (ref 0.7–4.0)
MCH: 26.8 pg (ref 26.0–34.0)
MCHC: 32.5 g/dL (ref 30.0–36.0)
MCV: 82.4 fL (ref 80.0–100.0)
Monocytes Absolute: 0.4 10*3/uL (ref 0.1–1.0)
Monocytes Relative: 6 %
Neutro Abs: 5 10*3/uL (ref 1.7–7.7)
Neutrophils Relative %: 67 %
Platelets: 438 10*3/uL — ABNORMAL HIGH (ref 150–400)
RBC: 3.47 MIL/uL — ABNORMAL LOW (ref 3.87–5.11)
RDW: 16.3 % — ABNORMAL HIGH (ref 11.5–15.5)
WBC: 7.5 10*3/uL (ref 4.0–10.5)
nRBC: 0 % (ref 0.0–0.2)

## 2021-11-21 LAB — COMPREHENSIVE METABOLIC PANEL
ALT: 18 U/L (ref 0–44)
AST: 21 U/L (ref 15–41)
Albumin: 2.9 g/dL — ABNORMAL LOW (ref 3.5–5.0)
Alkaline Phosphatase: 106 U/L (ref 38–126)
Anion gap: 6 (ref 5–15)
BUN: 14 mg/dL (ref 6–20)
CO2: 24 mmol/L (ref 22–32)
Calcium: 9.2 mg/dL (ref 8.9–10.3)
Chloride: 110 mmol/L (ref 98–111)
Creatinine, Ser: 0.61 mg/dL (ref 0.44–1.00)
GFR, Estimated: >60 mL/min (ref >60)
Glucose, Bld: 118 mg/dL — ABNORMAL HIGH (ref 70–99)
Potassium: 4.4 mmol/L (ref 3.5–5.1)
Sodium: 140 mmol/L (ref 135–145)
Total Bilirubin: 0.1 mg/dL — ABNORMAL LOW (ref 0.3–1.2)
Total Protein: 7.8 g/dL (ref 6.5–8.1)

## 2021-11-21 LAB — POC URINE PREG, ED: Preg Test, Ur: NEGATIVE

## 2021-11-21 LAB — PROTIME-INR
INR: 1.1 (ref 0.8–1.2)
Prothrombin Time: 13.7 seconds (ref 11.4–15.2)

## 2021-11-21 LAB — LACTIC ACID, PLASMA: Lactic Acid, Venous: 1.4 mmol/L (ref 0.5–1.9)

## 2021-11-21 MED ORDER — LACTATED RINGERS IV BOLUS (SEPSIS)
500.0000 mL | Freq: Once | INTRAVENOUS | Status: AC
  Administered 2021-11-21: 500 mL via INTRAVENOUS

## 2021-11-21 NOTE — ED Provider Notes (Signed)
?[facility] ?Provider Note ? ? ?CSN: [id number] ?Arrival date & time: 11/21/21  1810 ? ?  ? ?History ? ?Chief Complaint  ?Patient presents with  ? Altered Mental Status  ? ? ?Karen Mcguire is a 56 y.o. female. ? ? ?Altered Mental Status ?Associated symptoms: weakness (Chronic, lower extremities, no worsening)   ?Associated symptoms: no abdominal pain, no fever, no headaches, no light-headedness, no nausea and no vomiting   ?Patient presents from rehab facility for altered mental status.  She was reportedly threatening staff.  At baseline, she has developmental delay.  Additional medical history includes HIV, schizoaffective disorder, borderline personality disorder, polysubstance abuse, HLD, epidural abscess with quadriplegia, and a chronic sacral wound.  She has been receiving antibiotics through a PICC line that is in place in right upper extremity.  This is for treatment of her sacral decubitus ulcer.  Patient reports that she has no idea why they sent her here. ? ?History per facility: No answer on telephone call. ?  ? ?Home Medications ?Prior to Admission medications   ?Medication Sig Start Date End Date Taking? Authorizing Provider  ?cephALEXin (KEFLEX) 500 MG capsule Take 1 capsule (500 mg total) by mouth 4 (four) times daily. 11/22/21  Yes Godfrey Pick, MD  ?acetaminophen (TYLENOL) 325 MG tablet Take 650 mg by mouth every 6 (six) hours as needed.    [provider]  ?benztropine (COGENTIN) 1 MG tablet Take 1 mg by mouth every 6 (six) hours as needed for tremors.    [provider]  ?ceFEPime (MAXIPIME) IVPB Inject 2 g into the vein daily.    [provider]  ?cetirizine (ZYRTEC) 10 MG tablet Take 10 mg by mouth daily.    [provider]  ?Cholecalciferol (VITAMIN D3) 2000 UNITS TABS Take 2,000 Units by mouth daily.    [provider]  ?divalproex (DEPAKOTE) 250 MG DR tablet Take 250 mg by mouth daily.    [provider]  ?divalproex  (DEPAKOTE) 500 MG DR tablet Take 1,000 mg by mouth at bedtime. Taking two and one half tablets at night per FL-2 /tkk    [provider]  ?docusate sodium (COLACE) 100 MG capsule Take 100 mg by mouth 3 (three) times daily.    [provider]  ?ferrous sulfate 324 MG TBEC Take 324 mg by mouth.    [provider]  ?FLUDROCORTISONE ACETATE PO Take by mouth.    [provider]  ?folic acid (FOLVITE) 409 MCG tablet Take 400 mcg by mouth daily.    [provider]  ?haloperidol (HALDOL) 10 MG tablet Take 5 mg by mouth 2 (two) times daily as needed (anxiety).    [provider]  ?haloperidol decanoate (HALDOL DECANOATE) 50 MG/ML injection Inject 150 mg into the muscle every 28 (twenty-eight) days.    [provider]  ?hydrOXYzine (VISTARIL) 25 MG capsule Take 25 mg by mouth at bedtime.    [provider]  ?lithium carbonate 300 MG capsule Take 300 mg by mouth 2 (two) times daily.    [provider]  ?medroxyPROGESTERone (DEPO-PROVERA) 150 MG/ML injection Inject 150 mg into the muscle every 3 (three) months.    [provider]  ?MIDODRINE HCL PO Take 5 mg by mouth.    [provider]  ?Multiple Vitamin (MULTIVITAMIN ADULT PO) Take by mouth.    [provider]  ?naproxen (NAPROSYN) 500 MG tablet Take 500 mg by mouth 2 (two) times daily with a meal.  [provider]  ?OLANZapine (ZYPREXA) 20 MG tablet Take 20 mg by mouth at bedtime.    [provider]  ?pantoprazole (PROTONIX) 40 MG tablet Take 40 mg by mouth daily.    [provider]  ?Potassium Chloride Crys ER (KLOR-CON M20 PO) Take by mouth.    [provider]  ?sertraline (ZOLOFT) 100 MG tablet Take 100 mg by mouth daily.    [provider]  ?Thiamine HCl (THIAMINE PO) Take by mouth.    [provider]  ?trihexyphenidyl (ARTANE) 2 MG tablet Take 2 mg by mouth 3 (three) times daily with meals.    [provider]  ?TRIUMEQ 600-50-300 MG tablet TAKE ONE TABLET BY MOUTH DAILY 08/09/18   Michel Bickers, MD  ?[facility] Tosylate [facility]) 40 MG CAPS Take 40 mg by mouth daily.    [provider]  ?   ? ?Allergies    ?Penicillins   ? ?Review of Systems   ?Review of Systems  ?Constitutional:  Negative for activity change, appetite change, fatigue and fever.  ?Respiratory:  Negative for shortness of breath.   ?Cardiovascular:  Negative for chest pain.  ?Gastrointestinal:  Positive for diarrhea. Negative for abdominal pain, nausea and vomiting.  ?Genitourinary:   ?     Chronic indwelling Foley catheter  ?Skin:  Positive for wound (Chronic, no worsening).  ?Neurological:  Positive for weakness (Chronic, lower extremities, no worsening). Negative for dizziness, light-headedness, numbness and headaches.  ?Psychiatric/Behavioral:  Negative for dysphoric mood and suicidal ideas. The patient is not nervous/anxious.   ?All other systems reviewed and are negative. ? ?Physical Exam ?Updated Vital Signs ?BP 96/73   Pulse (!) 103   Temp 98 ?F (36.7 ?C)   Resp 16   Ht 5' (1.524 m)   Wt 63 kg   SpO2 99%   BMI 27.13 kg/m?  ?Physical Exam ?Vitals and nursing note reviewed.  ?Constitutional:   ?   General: She is not in acute distress. ?   Appearance: Normal appearance. She is well-developed and normal weight. She is not toxic-appearing or diaphoretic.  ?HENT:  ?   Head: Normocephalic and atraumatic.  ?   Right Ear: External ear normal.  ?   Left Ear: External ear normal.  ?   Nose: Nose normal. No congestion or rhinorrhea.  ?   Mouth/Throat:  ?   Mouth: Mucous membranes are moist.  ?   Pharynx: Oropharynx is clear.  ?Eyes:  ?   Extraocular Movements: Extraocular movements intact.  ?   Conjunctiva/sclera: Conjunctivae normal.  ?Cardiovascular:  ?   Rate and Rhythm: Normal rate and regular rhythm.  ?   Heart sounds: No murmur heard. ?Pulmonary:  ?   Effort: Pulmonary effort is normal. No respiratory distress.  ?    Breath sounds: Normal breath sounds. No wheezing, rhonchi or rales.  ?Abdominal:  ?   Palpations: Abdomen is soft.  ?   Tenderness: There is no abdominal tenderness.  ?Musculoskeletal:     ?   General: No swelling.  ?   Cervical back: Neck supple.  ?   Right lower leg: No edema.  ?   Left lower leg: No edema.  ?Skin: ?   General: Skin is warm and dry.  ?   Capillary Refill: Capillary refill takes less than 2 seconds.  ?   Comments: Chronic sacral wound.  No evidence of purulence or surrounding erythema  ?Neurological:  ?   Mental Status: She is alert and oriented  to person, place, and time. Mental status is at baseline.  ?   Cranial Nerves: No cranial nerve deficit.  ?   Sensory: No sensory deficit.  ?   Motor: Weakness (Chronic lower extremity weakness) present.  ?Psychiatric:     ?   Mood and Affect: Mood normal.     ?   Behavior: Behavior normal.  ? ? ? ?ED Results / Procedures / Treatments   ?Labs ?(all labs ordered are listed, but only abnormal results are displayed) ?Labs Reviewed  ?COMPREHENSIVE METABOLIC PANEL - Abnormal; Notable for the following components:  ?    Result Value  ? Glucose, Bld 118 (*)   ? Albumin 2.9 (*)   ? Total Bilirubin 0.1 (*)   ? All other components within normal limits  ?CBC WITH DIFFERENTIAL/PLATELET - Abnormal; Notable for the following components:  ? RBC 3.47 (*)   ? Hemoglobin 9.3 (*)   ? HCT 28.6 (*)   ? RDW 16.3 (*)   ? Platelets 438 (*)   ? All other components within normal limits  ?URINALYSIS, ROUTINE W REFLEX MICROSCOPIC - Abnormal; Notable for the following components:  ? Color, Urine YELLOW (*)   ? APPearance HAZY (*)   ? Protein, ur 30 (*)   ? Nitrite POSITIVE (*)   ? Leukocytes,Ua LARGE (*)   ? WBC, UA >50 (*)   ? Bacteria, UA FEW (*)   ? All other components within normal limits  ?CULTURE, BLOOD (ROUTINE X 2)  ?CULTURE, BLOOD (ROUTINE X 2)  ?URINE CULTURE  ?LACTIC ACID, PLASMA  ?PROTIME-INR  ?POC URINE PREG, ED  ? ? ?EKG ?EKG Interpretation ? ?Date/Time:  Monday Nov 21 2021 21:45:08 EDT ?Ventricular Rate:  95 ?PR Interval:  125 ?QRS Duration: 82 ?QT Interval:  361 ?QTC Calculation: 452 ?R Axis:   63 ?Text Interpretation: Sinus rhythm Abnormal R-wave progression, early transition Baselin

## 2021-11-21 NOTE — ED Notes (Signed)
Pt provided with meal tray and drink ?

## 2021-11-21 NOTE — ED Notes (Addendum)
Pt changed and repositioned. Dr repacked wound and new dressing applied.  ?

## 2021-11-21 NOTE — ED Notes (Signed)
Patient transported to XR. 

## 2021-11-21 NOTE — ED Triage Notes (Signed)
Pt to the ED with [facility] from [facility] rehab with a history of developmental delay and a stage 4 sacral wound with osteomyelitis. Pt is also HIV positive. ? ?Pt has been altered and threatening staff. Pt has a chronic foley with decreased urine output down from 800 ml a day to 30 ml a day. ? ?Pt has a midline in her rt arm, and has been sent to the ED for evaluation for sepsis. ?  ?

## 2021-11-22 LAB — URINALYSIS, ROUTINE W REFLEX MICROSCOPIC
Bilirubin Urine: NEGATIVE
Glucose, UA: NEGATIVE mg/dL
Hgb urine dipstick: NEGATIVE
Ketones, ur: NEGATIVE mg/dL
Nitrite: POSITIVE — AB
Protein, ur: 30 mg/dL — AB
Specific Gravity, Urine: 1.016 (ref 1.005–1.030)
WBC, UA: >50 WBC/hpf — ABNORMAL HIGH (ref 0–5)
pH: 6 (ref 5.0–8.0)

## 2021-11-22 MED ORDER — CEPHALEXIN 500 MG PO CAPS: 500.0000 mg | ORAL_CAPSULE | Freq: Four times a day (QID) | ORAL | 0 refills | Status: DC

## 2021-11-22 NOTE — Discharge Instructions (Addendum)
Your urine showed bacteria and white blood cells.  This could be due to colonization from your Foley catheter.  This could also be due to a new infection.  There is a prescription for antibiotics attached.  The doctor who manages your care should follow-up on results of urine culture and discontinue or adjust antibiotics as needed. ?

## 2021-11-22 NOTE — ED Notes (Signed)
Notified [facility] of patient needing transportation back to [facility] and Rehab. ?

## 2021-11-23 ENCOUNTER — Ambulatory Visit: Payer: Medicaid Other | Admitting: Internal Medicine

## 2021-11-23 ENCOUNTER — Other Ambulatory Visit: Payer: Self-pay

## 2021-11-23 ENCOUNTER — Ambulatory Visit (INDEPENDENT_AMBULATORY_CARE_PROVIDER_SITE_OTHER): Payer: Medicaid Other | Admitting: Infectious Disease

## 2021-11-23 ENCOUNTER — Encounter: Payer: Self-pay | Admitting: Infectious Disease

## 2021-11-23 VITALS — BP 142/118 | HR 122 | Temp 97.1°F

## 2021-11-23 DIAGNOSIS — G061 Intraspinal abscess and granuloma: Secondary | ICD-10-CM | POA: Diagnosis not present

## 2021-11-23 DIAGNOSIS — L89154 Pressure ulcer of sacral region, stage 4: Secondary | ICD-10-CM

## 2021-11-23 DIAGNOSIS — B20 Human immunodeficiency virus [HIV] disease: Secondary | ICD-10-CM

## 2021-11-23 DIAGNOSIS — G8250 Quadriplegia, unspecified: Secondary | ICD-10-CM | POA: Diagnosis not present

## 2021-11-23 DIAGNOSIS — R8271 Bacteriuria: Secondary | ICD-10-CM

## 2021-11-23 MED ORDER — TRIUMEQ 600-50-300 MG PO TABS: 1.0000 | ORAL_TABLET | Freq: Every day | ORAL | 11 refills | Status: DC
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
## 2021-11-23 NOTE — Progress Notes (Signed)
PICC Removal  ? ?Labs drawn via PICC per Dr. Tommy Medal.  ? ?PICC length & location:  43 cm right upper arm (measurement obtained from Roderic Ovens, nurse at [facility] was placed by [facility]) ?Removed per verbal order from:  Dr. Tommy Medal ? ?Blood thinners:  none per chart review ?Platelet count:  [id number] (11/21/21) ? ?Site assessment: Dressing clean and dry. Extremity warm and dry with palpable radial pulse. No redness, drainage, or swelling present at insertion site.  ? ?Pre-removal vital signs:  ?HR:  114 ?BP:  137/81 ?SpO2:  99% RA ? ?Patient unable to lie in supine position. No sutures present. Insertion site cleaned with CHG, catheter removed and petroleum dressing applied. Tip intact. Pressure held until hemostasis achieved.  ? ? ?Length of catheter removed:  43 cm ? ? ?Education provided to patient:  ?Seek emergent care if you develop numbness or tingling, pain, redness, or swelling in the extremity. Seek emergent care if dressing becomes soaked with blood or if sharp pain, shortness of breath, chest pain, or neurological symptoms present.  ?No heavy lifting with affected arm for 48 hours, leave dressing in place for 24 hours. May shower once dressing is removed.  ?Notify the clinic if you develop a fever or if warmth, redness, tenderness, or drainage develop at the site.  ? ?Patient verbalized understanding and agreement, all questions answered. Patient tolerated procedure well and remained in clinic under the care of RN 30 minutes post removal.  ? ?Post-observation vital signs:  ?HR:  117 ?BP:  107/75 ?SpO2:  97% RA ? ?Notified patient's SNF and [facility] pharmacy team of removal. This note was provided to patient's facility.  ? ?Beryle Flock, RN ? ?  ?

## 2021-11-23 NOTE — Patient Instructions (Signed)
Followup with Dr. Wendie Simmer in 2 months ?

## 2021-11-24 LAB — URINE CULTURE: Culture: >=100000 — AB

## 2021-11-25 ENCOUNTER — Telehealth: Payer: Self-pay | Admitting: *Deleted

## 2021-11-25 NOTE — Telephone Encounter (Signed)
Post ED Visit - Positive Culture Follow-up ? ?Culture report reviewed by antimicrobial stewardship pharmacist: ?[facility] Team ?'[]'$  Elenor Quinones, Pharm.D. ?'[]'$  Heide Guile, Pharm.D., BCPS AQ-ID ?'[]'$  Parks Neptune, Pharm.D., BCPS ?'[]'$  Alycia Rossetti, Pharm.D., BCPS ?'[]'$  [place], Pharm.D., BCPS, AAHIVP ?'[]'$  Legrand Como, Pharm.D., BCPS, AAHIVP ?'[]'$  Salome Arnt, PharmD, BCPS ?'[]'$  Johnnette Gourd, PharmD, BCPS ?'[]'$  Hughes Better, PharmD, BCPS ?'[]'$  Leeroy Cha, PharmD ?'[]'$  Laqueta Linden, PharmD, BCPS ?'[]'$  Albertina Parr, PharmD ? ?[place] Team ?'[]'$  Leodis Sias, PharmD ?'[]'$  Lindell Spar, PharmD ?'[]'$  Royetta Asal, PharmD ?'[]'$  Graylin Shiver, Rph ?'[]'$  Rema Fendt) Glennon Mac, PharmD ?'[]'$  Arlyn Dunning, PharmD ?'[]'$  Netta Cedars, PharmD ?'[]'$  Dia Sitter, PharmD ?'[]'$  Leone Haven, PharmD ?'[]'$  Gretta Arab, PharmD ?'[]'$  Theodis Shove, PharmD ?'[]'$  Peggyann Juba, PharmD ?'[]'$  Reuel Boom, PharmD ? ? ?Positive urine culture ?Treated with Cephalexin, organism sensitive to the same and no further patient follow-up is required at this time.  Myna Bright, PA-C ? ?Ardeen Fillers ?11/25/2021, 11:07 AM ?  ?

## 2021-11-26 LAB — CULTURE, BLOOD (ROUTINE X 2)
Culture: NO GROWTH
Culture: NO GROWTH
Special Requests: ADEQUATE
Special Requests: ADEQUATE

## 2021-11-26 LAB — HIV-1 RNA QUANT-NO REFLEX-BLD
HIV 1 RNA Quant: 31 copies/mL — ABNORMAL HIGH
HIV-1 RNA Quant, Log: 1.49 Log copies/mL — ABNORMAL HIGH

## 2021-12-02 ENCOUNTER — Non-Acute Institutional Stay: Payer: Medicaid Other | Admitting: Family Medicine

## 2021-12-02 ENCOUNTER — Encounter: Payer: Self-pay | Admitting: Family Medicine

## 2021-12-02 VITALS — BP 112/64 | HR 99 | Resp 18 | Wt 124.0 lb

## 2021-12-02 DIAGNOSIS — B20 Human immunodeficiency virus [HIV] disease: Secondary | ICD-10-CM

## 2021-12-02 DIAGNOSIS — R634 Abnormal weight loss: Secondary | ICD-10-CM

## 2021-12-02 DIAGNOSIS — G8250 Quadriplegia, unspecified: Secondary | ICD-10-CM

## 2021-12-02 DIAGNOSIS — L89154 Pressure ulcer of sacral region, stage 4: Secondary | ICD-10-CM

## 2021-12-02 DIAGNOSIS — Z515 Encounter for palliative care: Secondary | ICD-10-CM

## 2021-12-04 ENCOUNTER — Encounter: Payer: Self-pay | Admitting: Family Medicine

## 2021-12-04 DIAGNOSIS — Z515 Encounter for palliative care: Secondary | ICD-10-CM | POA: Insufficient documentation
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
## 2022-02-22 ENCOUNTER — Ambulatory Visit: Payer: Medicaid Other | Admitting: Infectious Disease
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
## 2022-03-15 ENCOUNTER — Other Ambulatory Visit: Payer: Self-pay

## 2022-03-15 ENCOUNTER — Telehealth: Payer: Self-pay

## 2022-03-15 ENCOUNTER — Ambulatory Visit (INDEPENDENT_AMBULATORY_CARE_PROVIDER_SITE_OTHER): Payer: Medicaid Other | Admitting: Infectious Disease

## 2022-03-15 ENCOUNTER — Encounter: Payer: Self-pay | Admitting: Infectious Disease

## 2022-03-15 VITALS — BP 117/80 | HR 90 | Temp 97.4°F | Ht 63.0 in | Wt 118.4 lb

## 2022-03-15 DIAGNOSIS — G061 Intraspinal abscess and granuloma: Secondary | ICD-10-CM

## 2022-03-15 DIAGNOSIS — B20 Human immunodeficiency virus [HIV] disease: Secondary | ICD-10-CM

## 2022-03-15 DIAGNOSIS — F251 Schizoaffective disorder, depressive type: Secondary | ICD-10-CM

## 2022-03-15 DIAGNOSIS — L89154 Pressure ulcer of sacral region, stage 4: Secondary | ICD-10-CM | POA: Diagnosis not present

## 2022-03-15 DIAGNOSIS — F70 Mild intellectual disabilities: Secondary | ICD-10-CM | POA: Diagnosis not present

## 2022-03-15 DIAGNOSIS — G8250 Quadriplegia, unspecified: Secondary | ICD-10-CM | POA: Diagnosis not present

## 2022-03-15 MED ORDER — TRIUMEQ 600-50-300 MG PO TABS: 1.0000 | ORAL_TABLET | Freq: Every day | ORAL | 11 refills | Status: DC

## 2022-03-15 MED ORDER — PITAVASTATIN MAGNESIUM 4 MG PO TABS
4.0000 mg | ORAL_TABLET | Freq: Every day | ORAL | 11 refills | Status: DC
Start: 2022-03-15 — End: 2022-10-17

## 2022-03-15 NOTE — Addendum Note (Signed)
Addended by: Lin Landsman E on: 03/15/2022 10:16 AM   Modules accepted: Orders

## 2022-03-15 NOTE — Telephone Encounter (Signed)
Called and spoke with Lovey Newcomer, Therapist, sports at [facility] [phone]). Reviewed Report of Consultation sent back with patient. Per written orders from Dr. Tommy Medal, communicated that patient was sent with paper script to start pitavastatin 4 mg daily. Dr. Tommy Medal also sent patient with paper script to continue Triumeq. Noted on orders to ensure patient receiving Triumeq with food. Follow up appointment made for Tues, Jun 20, 2022 at 09:30.  Sandy verbalized understanding and had no additional questions. Provider's printed note was also sent with transport staff, Amy.  Binnie Kand, RN

## 2022-03-16 LAB — T-HELPER CELLS (CD4) COUNT (NOT AT ARMC)
CD4 % Helper T Cell: 56 % (ref 33–65)
CD4 T Cell Abs: 1138 /uL (ref 400–1790)

## 2022-03-18 LAB — COMPLETE METABOLIC PANEL WITH GFR
AG Ratio: 1 (calc) (ref 1.0–2.5)
ALT: 10 U/L (ref 6–29)
AST: 13 U/L (ref 10–35)
Albumin: 3.7 g/dL (ref 3.6–5.1)
Alkaline phosphatase (APISO): 68 U/L (ref 37–153)
BUN/Creatinine Ratio: 24 (calc) — ABNORMAL HIGH (ref 6–22)
BUN: 12 mg/dL (ref 7–25)
CO2: 22 mmol/L (ref 20–32)
Calcium: 9.7 mg/dL (ref 8.6–10.4)
Chloride: 106 mmol/L (ref 98–110)
Creat: 0.49 mg/dL — ABNORMAL LOW (ref 0.50–1.03)
Globulin: 3.8 g/dL (calc) — ABNORMAL HIGH (ref 1.9–3.7)
Glucose, Bld: 77 mg/dL (ref 65–99)
Potassium: 4.3 mmol/L (ref 3.5–5.3)
Sodium: 140 mmol/L (ref 135–146)
Total Bilirubin: 0.3 mg/dL (ref 0.2–1.2)
Total Protein: 7.5 g/dL (ref 6.1–8.1)
eGFR: 112 mL/min/{1.73_m2} (ref >=60)

## 2022-03-18 LAB — LIPID PANEL
Cholesterol: 132 mg/dL (ref <200)
HDL: 46 mg/dL — ABNORMAL LOW (ref >=50)
LDL Cholesterol (Calc): 62 mg/dL (calc)
Non-HDL Cholesterol (Calc): 86 mg/dL (calc) (ref <130)
Total CHOL/HDL Ratio: 2.9 (calc) (ref <5.0)
Triglycerides: 159 mg/dL — ABNORMAL HIGH (ref <150)

## 2022-03-18 LAB — CBC WITH DIFFERENTIAL/PLATELET
Absolute Monocytes: 466 cells/uL (ref 200–950)
Basophils Absolute: 50 cells/uL (ref 0–200)
Basophils Relative: 0.8 %
Eosinophils Absolute: 202 cells/uL (ref 15–500)
Eosinophils Relative: 3.2 %
HCT: 32.3 % — ABNORMAL LOW (ref 35.0–45.0)
Hemoglobin: 10.2 g/dL — ABNORMAL LOW (ref 11.7–15.5)
Lymphs Abs: 2136 cells/uL (ref 850–3900)
MCH: 24.4 pg — ABNORMAL LOW (ref 27.0–33.0)
MCHC: 31.6 g/dL — ABNORMAL LOW (ref 32.0–36.0)
MCV: 77.3 fL — ABNORMAL LOW (ref 80.0–100.0)
MPV: 9 fL (ref 7.5–12.5)
Monocytes Relative: 7.4 %
Neutro Abs: 3446 cells/uL (ref 1500–7800)
Neutrophils Relative %: 54.7 %
Platelets: 442 10*3/uL — ABNORMAL HIGH (ref 140–400)
RBC: 4.18 10*6/uL (ref 3.80–5.10)
RDW: 14.8 % (ref 11.0–15.0)
Total Lymphocyte: 33.9 %
WBC: 6.3 10*3/uL (ref 3.8–10.8)

## 2022-03-18 LAB — SEDIMENTATION RATE: Sed Rate: 63 mm/h — ABNORMAL HIGH (ref 0–30)

## 2022-03-18 LAB — RPR: RPR Ser Ql: REACTIVE — AB

## 2022-03-18 LAB — HIV-1 RNA QUANT-NO REFLEX-BLD
HIV 1 RNA Quant: 1150 Copies/mL — ABNORMAL HIGH
HIV-1 RNA Quant, Log: 3.06 Log cps/mL — ABNORMAL HIGH

## 2022-03-18 LAB — FLUORESCENT TREPONEMAL AB(FTA)-IGG-BLD: Fluorescent Treponemal ABS: NONREACTIVE

## 2022-03-18 LAB — C-REACTIVE PROTEIN: CRP: 17.1 mg/L — ABNORMAL HIGH (ref <8.0)

## 2022-03-18 LAB — RPR TITER: RPR Titer: 1:1 {titer} — ABNORMAL HIGH

## 2022-03-20 ENCOUNTER — Other Ambulatory Visit: Payer: Self-pay | Admitting: Infectious Disease

## 2022-03-20 ENCOUNTER — Telehealth: Payer: Self-pay

## 2022-03-20 DIAGNOSIS — B20 Human immunodeficiency virus [HIV] disease: Secondary | ICD-10-CM

## 2022-03-20 NOTE — Telephone Encounter (Signed)
I reached out to [facility] and spoke with Tillie Rung  and requested the patient's complete med list be faxed to our office. I will place in pharmacy box, so they can make sure there are no drug to drug interactions with Symtuza. I advised Tillie Rung that we will follow back up with them if it is safe to start the patient on Symtuza. Burnis Halling T Brooks Sailors

## 2022-03-20 NOTE — Telephone Encounter (Signed)
Called [facility] [phone]) to relay message below. Spoke to the nurse Nira Conn who stated that patients triumeq was discontinued on 02/11/22 because on non compliance and patient was started back on triumeq (taking it with iron) recently but she is still non compliant with taking all her prescribed medications. Nira Conn also stated that patient is  having some behavorial issue and she is seeing behavioral health with no improvement or help, and the patient is removing wound dressing and throwing it on the floor for the past 3-4 weeks.   Left a voice message for Amy to schedule appointment with pharmacy.

## 2022-03-20 NOTE — Telephone Encounter (Signed)
-----   Message from Truman Hayward, MD sent at 03/19/2022  8:14 PM EDT ----- Regarding: FW: Something is amiss. I bet her SNF stopped giving her Triumeq-was not on her MAR. We need info for them on what the H they are doing and make sure she is on antivirals. She also can't be on iron w Triumeq on empty stomach. Will want to add pan genotype and bring her back to meet w pharmacy ----- Message ----- From: Cheyenne Adas Lab Results In Sent: 03/15/2022   3:24 PM EDT To: Truman Hayward, MD

## 2022-03-20 NOTE — Telephone Encounter (Signed)
Do you want to proceed with switching to [place]? I can call the facility and let them know if you would like.

## 2022-03-21 NOTE — Telephone Encounter (Signed)
Thanks everyone! I think it is fine to wait until she sees me to switch to Symtuza, especially since we have not seen the medication list yet. Appreciate it!

## 2022-03-21 NOTE — Telephone Encounter (Signed)
She would have to separate from her multivitamins and iron, but I can make that happened when I see her next week.

## 2022-03-21 NOTE — Telephone Encounter (Signed)
Amy with [facility] called front desk. Kim to schedule patient with pharmacy for adherence check and remind facility to fax complete med list to triage for pharmacy to review.   Beryle Flock, RN

## 2022-03-21 NOTE — Telephone Encounter (Signed)
Received and reviewed patient medication list today. Patient has been taking fludrocortisone daily (list says it is for quadriplegia) since April. This would be a concerning interaction with Symtuza given risk for increased steroid levels. Patient is also on monthly Haldol IM injections and tramadol prn for pain; patient would need to be monitored for increased sedation/respiratory depression. Would recommend another alternative ART regimen.

## 2022-03-28 ENCOUNTER — Ambulatory Visit (INDEPENDENT_AMBULATORY_CARE_PROVIDER_SITE_OTHER): Payer: Medicaid Other | Admitting: Pharmacist

## 2022-03-28 ENCOUNTER — Other Ambulatory Visit: Payer: Self-pay

## 2022-03-28 DIAGNOSIS — B20 Human immunodeficiency virus [HIV] disease: Secondary | ICD-10-CM | POA: Diagnosis not present

## 2022-03-28 MED ORDER — BIKTARVY 50-200-25 MG PO TABS: 1.0000 | ORAL_TABLET | Freq: Every day | ORAL | 5 refills | Status: DC

## 2022-03-28 NOTE — Progress Notes (Signed)
03/28/2022  HPI: Karen Mcguire is a 56 y.o. female who presents to the [place] clinic for HIV follow-up.  Patient Active Problem List   Diagnosis Date Noted   Encounter for palliative care 12/04/2021   Abscess in epidural space of cervical spine 10/20/2021   Quadriplegia ([place]) 10/20/2021   Sacral decubitus ulcer, stage IV ([place]) 10/20/2021   Unintentional weight loss 11/23/2020   Poor dentition 11/27/2019   Polysubstance abuse ([place]) 10/22/2013   Skull fracture ([place]) 10/22/2013   Dyslipidemia 10/22/2013   Human immunodeficiency virus (HIV) disease ([place]) 01/29/2009   Schizoaffective disorder ([place]) 01/29/2009   Borderline personality disorder ([place]) 01/29/2009   MILD MENTAL RETARDATION 01/29/2009   ALLERGIC RHINITIS, SEASONAL 01/29/2009    Patient's Medications  New Prescriptions   BICTEGRAVIR-EMTRICITABINE-TENOFOVIR AF (BIKTARVY) 50-200-25 MG TABS TABLET    Take 1 tablet by mouth daily.  Previous Medications   ACETAMINOPHEN (TYLENOL) 325 MG TABLET    Take 650 mg by mouth every 6 (six) hours as needed.   AMOXICILLIN (AMOXIL) 500 MG TABLET    Take 500 mg by mouth 2 (two) times daily.   ASCORBIC ACID (VITAMIN C) 500 MG TABLET    Take 500 mg by mouth daily.   BENZTROPINE (COGENTIN) 1 MG TABLET    Take 1 mg by mouth every 6 (six) hours as needed for tremors.   CETIRIZINE (ZYRTEC) 10 MG TABLET    Take 10 mg by mouth daily.   CHOLECALCIFEROL (VITAMIN D3) 2000 UNITS TABS    Take 2,000 Units by mouth daily.   DIVALPROEX (DEPAKOTE) 250 MG DR TABLET    Take 250 mg by mouth daily.   DIVALPROEX (DEPAKOTE) 500 MG DR TABLET    Take 1,000 mg by mouth at bedtime. Taking two and one half tablets at night per FL-2 /tkk   DOCUSATE SODIUM (COLACE) 100 MG CAPSULE    Take 100 mg by mouth 3 (three) times daily.   FERROUS SULFATE 324 MG TBEC    Take 324 mg by mouth.   FLUDROCORTISONE ACETATE PO    Take 0.1 mg by mouth daily.   FOLIC ACID (FOLVITE) 062 MCG TABLET    Take 800 mcg by mouth daily.    HALOPERIDOL (HALDOL) 10 MG TABLET    Take 5 mg by mouth 2 (two) times daily as needed (anxiety).   HALOPERIDOL DECANOATE (HALDOL DECANOATE) 50 MG/ML INJECTION    Inject 150 mg into the muscle every 28 (twenty-eight) days.   HYDROXYZINE (VISTARIL) 25 MG CAPSULE    Take 25 mg by mouth at bedtime.   LITHIUM CARBONATE 300 MG CAPSULE    Take 300 mg by mouth 2 (two) times daily.   LORAZEPAM (ATIVAN) 2 MG/ML INJECTION    Inject 0.5 mg into the vein daily as needed for sedation (Inject 0.5 mg intramuscularly every 24 hours as needed for Agitation/Aggression for 7 days).   MEDROXYPROGESTERONE (DEPO-PROVERA) 150 MG/ML INJECTION    Inject 150 mg into the muscle every 3 (three) months.   MIDODRINE HCL PO    Take 5 mg by mouth 2 (two) times daily.   MULTIPLE VITAMIN (MULTIVITAMIN ADULT PO)    Take by mouth.   NAPROXEN (NAPROSYN) 500 MG TABLET    Take 500 mg by mouth 2 (two) times daily with a meal.   NUTRITIONAL SUPPLEMENTS (PROMOD) LIQD    Take 30 mLs by mouth 2 (two) times daily.   OLANZAPINE (ZYPREXA) 2.5 MG TABLET    Take 3.75 mg by mouth at  bedtime.   PANTOPRAZOLE (PROTONIX) 40 MG TABLET    Take 40 mg by mouth daily.   PITAVASTATIN MAGNESIUM 4 MG TABS    Take 4 mg by mouth daily.   POTASSIUM CHLORIDE CRYS ER (KLOR-CON M20 PO)    Take 1 tablet by mouth 2 (two) times daily.   SERTRALINE (ZOLOFT) 100 MG TABLET    Take 100 mg by mouth daily.   THIAMINE HCL (THIAMINE PO)    Take 100 mg by mouth daily.   TRAMADOL (ULTRAM) 50 MG TABLET    Take 50 mg by mouth every 6 (six) hours as needed for moderate pain.   TRIHEXYPHENIDYL (ARTANE) 2 MG TABLET    Take 2 mg by mouth 3 (three) times daily with meals.   VALBENAZINE TOSYLATE (INGREZZA) 40 MG CAPS    Take 40 mg by mouth daily.   ZINC GLUCONATE 50 MG TABLET    Take 50 mg by mouth daily.  Modified Medications   No medications on file  Discontinued Medications   ABACAVIR-DOLUTEGRAVIR-LAMIVUDINE (TRIUMEQ) 600-50-300 MG TABLET    Take 1 tablet by mouth daily with  breakfast. Take with a meal always    Allergies: Allergies  Allergen Reactions   Penicillins Swelling    12/02/21 Pt has received Cephalosporin and currently is taking Amoxicillin without difficulty.      Past Medical History: Past Medical History:  Diagnosis Date   Bipolar disorder ([place])    Hepatitis B carrier ([place])    Hepatitis C without mention of hepatic coma    History of traumatic head injury    HIV infection ([place])    MR (mental retardation), moderate    Schizoaffective disorder ([place])     Social History: Social History   Socioeconomic History   Marital status: Single    Spouse name: Not on file   Number of children: Not on file   Years of education: Not on file   Highest education level: Not on file  Occupational History   Not on file  Tobacco Use   Smoking status: Former    Packs/day: 0.40    Years: 2.00    Total pack years: 0.80    Types: Cigarettes   Smokeless tobacco: Former   Tobacco comments:    States she quit a long time ago  Substance and Sexual Activity   Alcohol use: No    Alcohol/week: 0.0 standard drinks of alcohol   Drug use: No    Comment: used marijuana, pills, crack, heroin. Nothing since she has been at [facility] . Now lives at [facility].   Sexual activity: Not Currently    Partners: Male    Birth control/protection: Injection    Comment: declined condoms  Other Topics Concern   Not on file  Social History Narrative   Not on file   Social Determinants of Health   Financial Resource Strain: Not on file  Food Insecurity: Not on file  Transportation Needs: Not on file  Physical Activity: Not on file  Stress: Not on file  Social Connections: Not on file    Labs: Lab Results  Component Value Date   HIV1RNAQUANT 1,150 (H) 03/15/2022   HIV1RNAQUANT 31 (H) 11/23/2021   HIV1RNAQUANT 54 (H) 09/14/2021   CD4TABS 1,138 03/15/2022   CD4TABS 1,138 11/23/2020   CD4TABS 1,131 10/30/2019    RPR and STI Lab Results   Component Value Date   LABRPR REACTIVE (A) 03/15/2022   LABRPR NON-REACTIVE 11/23/2020   LABRPR NON-REACTIVE 10/30/2019  LABRPR NON-REACTIVE 07/15/2018   LABRPR NON-REACTIVE 04/16/2017   RPRTITER 1:1 (H) 03/15/2022    STI Results GC CT  04/16/2017 12:00 AM Negative  Negative   03/31/2016 12:00 AM Negative  Negative   10/09/2013 12:00 AM NG: Negative  CT: Negative     Hepatitis B Lab Results  Component Value Date   HEPBSAB POS (A) 10/09/2013   HEPBSAG NEGATIVE 10/09/2013   HEPBCAB REACTIVE (A) 10/09/2013   Hepatitis C No results found for: "HEPCAB", "HCVRNAPCRQN" Hepatitis A Lab Results  Component Value Date   HAV REACTIVE (A) 10/09/2013   Lipids: Lab Results  Component Value Date   CHOL 132 03/15/2022   TRIG 159 (H) 03/15/2022   HDL 46 (L) 03/15/2022   CHOLHDL 2.9 03/15/2022   VLDL 34 (H) 03/14/2016   LDLCALC 62 03/15/2022    Current HIV Regimen: Triumeq  Assessment: Karen Mcguire is here today to follow up for her HIV infection after having a detectable HIV viral load recently when she saw Dr. Tommy Medal. Viral load at that time was 1,150. Upon further investigation, Karen Mcguire told us that she has been refusing her medications, including her Triumeq. Tried to discuss this with her today but she was very quiet and not very forthcoming. She finally did tell me that sometimes she does not take any of her medications because she simply does not feel like it. I advised her of the risk of this and potential for development of resistance. I am not sure she grasps the importance.   We will change her to [facility] today for a higher barrier to resistance medication. She is taking ferrous sulfate and a multivitamin every day. Wrote orders for [facility] to discontinue Triumeq and start [place] once daily. Asked them to separate the Biktarvy from the iron and multivitamin by at least 2 hours. Gave hard copy prescription for Biktarvy and also wrote instructions on AVS. Will get HIV  genotype today to ensure no resistance has developed. She will see Dr. Tommy Medal in November.  Plan: - Stop Triumeq [employer]; separate from MV and iron supplementations - HIV RNA with reflex to genotype - F/u with Dr. Tommy Medal 11/28 at 9100m Donneisha Beane L. KEber Hong PharmD, BCIDP, AAHIVP, CPP Clinical Pharmacist Practitioner Infectious Diseases [facility] 03/28/2022, 10:39 AM

## 2022-03-30 LAB — HIV RNA, RTPCR W/R GT (RTI, PI,INT)
HIV 1 RNA Quant: 47 copies/mL — ABNORMAL HIGH
HIV-1 RNA Quant, Log: 1.67 Log copies/mL — ABNORMAL HIGH

## 2022-05-23 ENCOUNTER — Non-Acute Institutional Stay: Payer: Medicaid Other | Admitting: Family Medicine

## 2022-05-23 ENCOUNTER — Encounter: Payer: Self-pay | Admitting: Family Medicine

## 2022-05-23 VITALS — BP 120/76 | HR 102 | Resp 16

## 2022-05-23 DIAGNOSIS — Z515 Encounter for palliative care: Secondary | ICD-10-CM

## 2022-05-23 DIAGNOSIS — L89154 Pressure ulcer of sacral region, stage 4: Secondary | ICD-10-CM

## 2022-05-23 DIAGNOSIS — A530 Latent syphilis, unspecified as early or late: Secondary | ICD-10-CM

## 2022-05-23 DIAGNOSIS — B20 Human immunodeficiency virus [HIV] disease: Secondary | ICD-10-CM

## 2022-05-23 NOTE — Progress Notes (Signed)
[facility] Consult Note Telephone: [phone]  Fax: [phone]    Date of encounter: 05/23/22 2:36 PM PATIENT NAME: Karen Mcguire [address] [place] [address]   [phone] (home)  DOB: [birth year] MRN: [id number] PRIMARY CARE PROVIDER:    Lucia Gaskins, MD (Inactive),  [place] [address] [phone]  REFERRING PROVIDER:   No referring provider defined for this encounter. N/A  RESPONSIBLE PARTY:    Contact Information     Name Relation Home Work [place]   [place]  [phone]     [place]  [phone]         I met face to face with patient in her skilled nursing facility. Palliative Care was asked to follow this patient by consultation request of  No ref. provider found to address advance care planning and complex medical decision making. This is a follow up visit   ASSESSMENT , SYMPTOM MANAGEMENT AND PLAN / RECOMMENDATIONS:  HIV disease Concern for possible development of resistance vs non-compliance with significant elevation of viral load. Continue follow up as indicated per Infectious Disease with change from Triumeq to [place].  2.  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  Sacral decubitus ulcer, stage IV Pt resistive to facility staff doing dressing changes/wound care and non-compliant with offloading. Has severe protein calorie malnutrition which severely impacts wound healing.  4.  Encounter for follow up with Palliative Care Will follow up next ID note but may be appropriate to review goals of care with DSS guardian and possible hospice referral.   Advance Care Planning/Goals of  Care: Identification of a healthcare agent-has DSS Guardian-Karen Mcguire Review of an existing advance directive document -MOST.  CODE STATUS: MOST as of 05/26/21: Full Code with Full interventions if indicated      Follow up Palliative Care Visit: Palliative care will continue to follow for complex medical decision making, advance care planning, and clarification of goals. Return 4 weeks or prn.   This visit was coded based on medical decision making (MDM).  PPS: 30%  HOSPICE ELIGIBILITY/DIAGNOSIS: TBD  Chief Complaint:  Palliative Care is continuing to follow patient for chronic medical management in setting of HIV with stage IV sacral wound and hx of epidural abscess with BLE paralysis.    HISTORY OF PRESENT ILLNESS:  Karen Mcguire is a 56 y.o. year old female with HIV, hx of epidural abscess with BLE paralysis, stage IV sacral decubitus.  Pt is c/o "burning in behind" and wants this provider to let her out of the facility.  She is playing bingo in the main room and will allow provider to help her but then will move her gerichair so that table is between provider and herself.  Nursing staff says she is total care except for feeding and incontinent of bowel and bladder, intermittently will throw herself in the floor and refuse medications.   Recent visit with Dr Karen Mcguire, infectious disease specialist who has recommended offloading pressure from sacral wound but pt is non-compliant and he found no evidence of osteomyelitis, with regard to past hx of epidural abscess with BLE paralysis he sees she is on chronic Amoxicillin therapy but was going to follow up on that recommendation.  He also follows her for her HIV. At the time of the office visit on 03/15/22 he noted she would continue Triumeq. Review of clinical pharmacist HIV support note 03/28/22 indicates the following plan due to patient's non-compliance with taking Triumeq:    Labs:      Lab Results  Component Value Date     HIV1RNAQUANT 1,150 (H) 03/15/2022    HIV1RNAQUANT 31 (H) 11/23/2021    HIV1RNAQUANT 54 (H) 09/14/2021    CD4TABS 1,138 03/15/2022    CD4TABS 1,138 11/23/2020    CD4TABS 1,131 10/30/2019      RPR and STI      Lab Results  Component Value Date    LABRPR REACTIVE (A) 03/15/2022    LABRPR NON-REACTIVE 11/23/2020    LABRPR NON-REACTIVE 10/30/2019    LABRPR NON-REACTIVE 07/15/2018    LABRPR NON-REACTIVE 04/16/2017    RPRTITER 1:1 (H) 03/15/2022   Plan: - Stop Triumeq - Start Biktarvy; separate from MV and iron supplementations - HIV RNA with reflex to genotype - F/u with Dr. Tommy Mcguire 11/28 at 974m History obtained from review of EMR, discussion with primary team, and interview with family, facility staff/caregiver and/or Ms. Karen Mcguire     I reviewed EMR for available labs, medications, imaging, studies and related documents.  Records reviewed and summarized above.   ROS General: NAD EYES: denies vision changes ENMT: denies dysphagia Cardiovascular: denies chest pain, denies DOE Pulmonary: denies cough, denies increased SOB Abdomen: endorses good appetite, denies constipation, staff endorse incontinence of bowel GU: denies dysuria, staff endorse incontinence of urine MSK: staff notes paralysis of BLE, staff report pt "throws herself in the floor" Skin: denies rashes or wounds Neurological: endorses burning pain of buttocks, denies insomnia Psych: staff endorse mood lability with acting out behaviors Heme/lymph/immuno: denies bruises, abnormal bleeding  Physical Exam: Current and past weights: 118 lbs 6.4 ounces as of 03/15/22, weight 138.6 lbs as of 11/21/21 Constitutional: NAD General: thin  CV: S1S2, RRR, no LE edema Pulmonary: CTAB, no increased work of breathing, no cough, room air Abdomen: normo-active BS + 4 quadrants, soft and non tender GU: deferred MSK: no sarcopenia, moves upper extremities, bedbound/chairbound Skin: warm and dry, no rashes or wounds on visible  skin Neuro:  paralysis of BLE,  no cognitive impairment Psych: non-anxious affect, A and O x 3 Hem/lymph/immuno: no widespread bruising   Thank you for the opportunity to participate in the care of Ms. Karen Mcguire  The palliative care team will continue to follow. Please call our office at [phone] we can be of additional assistance.   KMarijo Conception FNP -C  COVID-19 PATIENT SCREENING TOOL Asked and negative response unless otherwise noted:   Have you had symptoms of covid, tested positive or been in contact with someone with symptoms/positive test in the past 5-10 days?  No

## 2022-06-10 NOTE — Progress Notes (Incomplete)
[facility] Consult Note Telephone: [phone]  Fax: [phone]    Date of encounter: 05/23/22 2:36 PM PATIENT NAME: Karen Mcguire [address] [place] [address]   [phone] (home)  DOB: 05/13/1966 MRN: [id number] PRIMARY CARE PROVIDER:    Lucia Gaskins, MD (Inactive),  [place] [address] [phone]  REFERRING PROVIDER:   No referring provider defined for this encounter. N/A  RESPONSIBLE PARTY:    Contact Information     Name Relation Home Work [place]   [place]  [phone]     [place]  [phone]         I met face to face with patient in her skilled nursing facility. Palliative Care was asked to follow this patient by consultation request of  No ref. provider found to address advance care planning and complex medical decision making. This is a follow up visit   ASSESSMENT , SYMPTOM MANAGEMENT AND PLAN / RECOMMENDATIONS:   Advance Care Planning/Goals of Care: Identification of a healthcare agent-has DSS Guardian-Karen Mcguire Review of an existing advance directive document -MOST.  CODE STATUS: MOST as of 05/26/21: Full Code with Full interventions if indicated      Follow up Palliative Care Visit: Palliative care will continue to follow for complex medical decision making, advance care planning, and clarification of goals. Return 4 weeks or prn.   This visit was coded based on medical decision making (MDM).  PPS: ***0%  HOSPICE ELIGIBILITY/DIAGNOSIS: TBD  Chief Complaint:   HISTORY OF PRESENT ILLNESS:  Karen Mcguire is a 56 y.o. year old female with *** .  Pt is c/o "burning in behind" and wants this provider to let her out.  Nursing staff says she is total care except for feeding and incontinent of bowel and bladder, intermittently will throw herself in the floor and refuse medications.    Recent visit with Dr Tommy Medal, infectious disease specialist who has recommended offloading pressure from sacral wound but pt is non-compliant and he found no evidence of osteomyelitis, with regard to past hx of epidural abscess he sees she is on chronic Amoxicillin therapy but was going to follow up on that recommendation.  He also follows her for her HIV. At the time of the office visit on 03/15/22 he noted she would continue Triumeq. Review of clinical pharmacist HIV support note 03/28/22 indicates the following plan due to patient's non-compliance with taking Triumeq:    Labs:      Lab Results  Component Value Date    HIV1RNAQUANT 1,150 (H) 03/15/2022    HIV1RNAQUANT 31 (H) 11/23/2021    HIV1RNAQUANT 54 (H) 09/14/2021    CD4TABS 1,138 03/15/2022    CD4TABS 1,138 11/23/2020    CD4TABS 1,131 10/30/2019      RPR and STI      Lab Results  Component Value Date    LABRPR REACTIVE (A) 03/15/2022    LABRPR NON-REACTIVE 11/23/2020    LABRPR NON-REACTIVE 10/30/2019    LABRPR NON-REACTIVE 07/15/2018    LABRPR NON-REACTIVE 04/16/2017    RPRTITER 1:1 (H) 03/15/2022   Plan: - Stop Triumeq - Start Biktarvy; separate from MV and iron supplementations - HIV RNA with reflex to genotype - F/u with Dr. Tommy Medal 11/28 at 949m History obtained from review of EMR, discussion with primary team, and interview with family, facility staff/caregiver and/or [name]     I reviewed EMR for available  labs, medications, imaging, studies and related documents.  There are no new records since last visit/Records reviewed and summarized above.   ROS General: NAD EYES: denies vision changes ENMT: denies dysphagia Cardiovascular: denies chest pain, denies DOE Pulmonary: denies cough, denies increased SOB Abdomen: endorses good appetite, denies constipation, endorses continence of bowel GU: denies dysuria, endorses continence of urine MSK:  denies increased weakness,  no falls reported Skin: denies  rashes or wounds Neurological: denies pain, denies insomnia Psych: Endorses positive mood Heme/lymph/immuno: denies bruises, abnormal bleeding  Physical Exam: Current and past weights: Constitutional: NAD General: thin  CV: S1S2, RRR, no LE edema Pulmonary: CTAB, no increased work of breathing, no cough, room air Abdomen: normo-active BS + 4 quadrants, soft and non tender GU: deferred MSK: no sarcopenia, moves upper extremities, bedbound/chairbound Skin: warm and dry, no rashes or wounds on visible skin Neuro:  paralysis of BLE,  no cognitive impairment Psych: non-anxious affect, A and O x 3 Hem/lymph/immuno: no widespread bruising   Thank you for the opportunity to participate in the care of Ms. Ronne Binning.  The palliative care team will continue to follow. Please call our office at [phone] if we can be of additional assistance.   Marijo Conception, FNP -C  COVID-19 PATIENT SCREENING TOOL Asked and negative response unless otherwise noted:   Have you had symptoms of covid, tested positive or been in contact with someone with symptoms/positive test in the past 5-10 days?

## 2022-06-11 DIAGNOSIS — A530 Latent syphilis, unspecified as early or late: Secondary | ICD-10-CM | POA: Insufficient documentation

## 2022-06-20 ENCOUNTER — Telehealth: Payer: Self-pay

## 2022-06-20 ENCOUNTER — Encounter: Payer: Self-pay | Admitting: Infectious Disease

## 2022-06-20 ENCOUNTER — Other Ambulatory Visit: Payer: Self-pay

## 2022-06-20 ENCOUNTER — Ambulatory Visit (INDEPENDENT_AMBULATORY_CARE_PROVIDER_SITE_OTHER): Payer: Medicaid Other | Admitting: Infectious Disease

## 2022-06-20 VITALS — BP 127/84 | HR 107 | Temp 97.9°F | Ht 63.0 in | Wt 122.7 lb

## 2022-06-20 DIAGNOSIS — G062 Extradural and subdural abscess, unspecified: Secondary | ICD-10-CM | POA: Diagnosis not present

## 2022-06-20 DIAGNOSIS — B20 Human immunodeficiency virus [HIV] disease: Secondary | ICD-10-CM

## 2022-06-20 DIAGNOSIS — G8250 Quadriplegia, unspecified: Secondary | ICD-10-CM

## 2022-06-20 DIAGNOSIS — L89154 Pressure ulcer of sacral region, stage 4: Secondary | ICD-10-CM

## 2022-06-20 DIAGNOSIS — T847XXA Infection and inflammatory reaction due to other internal orthopedic prosthetic devices, implants and grafts, initial encounter: Secondary | ICD-10-CM

## 2022-06-20 DIAGNOSIS — E785 Hyperlipidemia, unspecified: Secondary | ICD-10-CM | POA: Diagnosis not present

## 2022-06-20 DIAGNOSIS — T847XXD Infection and inflammatory reaction due to other internal orthopedic prosthetic devices, implants and grafts, subsequent encounter: Secondary | ICD-10-CM

## 2022-06-20 HISTORY — DX: Infection and inflammatory reaction due to other internal orthopedic prosthetic devices, implants and grafts, initial encounter: T84.7XXA

## 2022-06-20 HISTORY — DX: Extradural and subdural abscess, unspecified: G06.2

## 2022-06-20 NOTE — Addendum Note (Signed)
Addended by: Caffie Pinto on: 06/20/2022 10:10 AM   Modules accepted: Orders
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
## 2022-06-20 NOTE — Telephone Encounter (Signed)
Called and spoke with [place], RN to discuss report of consultation (printed office note) from Dr. Tommy Medal.  Per note, relayed that decubitus ulcer appeared improved. Per order from Dr. Tommy Medal, emphasized to stop the patient's oxcarbazepine immediately.   RN verbalized understanding and provided read-back. RN is aware that Dr. Lucianne Lei Dam's orders and impressions are available in the office note that was printed and sent back to the facility today with the patient and her caregiver, Amy. AVS was also printed and sent back with patient/caregiver. All questions answered.  Facility transportation staff member unavailable; left voicemail with details of patient's follow up appointment in February, and requested call back if any questions or concerns.  Binnie Kand, RN

## 2022-06-20 NOTE — Addendum Note (Signed)
Addended by: Lin Landsman E on: 06/20/2022 10:03 AM   Modules accepted: Orders

## 2022-06-21 LAB — T-HELPER CELLS (CD4) COUNT (NOT AT ARMC)
CD4 % Helper T Cell: 58 % (ref 33–65)
CD4 T Cell Abs: 776 /uL (ref 400–1790)

## 2022-06-22 LAB — COMPLETE METABOLIC PANEL WITH GFR
AG Ratio: 1.1 (calc) (ref 1.0–2.5)
ALT: 15 U/L (ref 6–29)
AST: 17 U/L (ref 10–35)
Albumin: 4 g/dL (ref 3.6–5.1)
Alkaline phosphatase (APISO): 85 U/L (ref 37–153)
BUN: 18 mg/dL (ref 7–25)
CO2: 21 mmol/L (ref 20–32)
Calcium: 9.8 mg/dL (ref 8.6–10.4)
Chloride: 104 mmol/L (ref 98–110)
Creat: 0.72 mg/dL (ref 0.50–1.03)
Globulin: 3.8 g/dL (calc) — ABNORMAL HIGH (ref 1.9–3.7)
Glucose, Bld: 123 mg/dL — ABNORMAL HIGH (ref 65–99)
Potassium: 4.5 mmol/L (ref 3.5–5.3)
Sodium: 137 mmol/L (ref 135–146)
Total Bilirubin: 0.3 mg/dL (ref 0.2–1.2)
Total Protein: 7.8 g/dL (ref 6.1–8.1)
eGFR: 99 mL/min/{1.73_m2} (ref >=60)

## 2022-06-22 LAB — LIPID PANEL
Cholesterol: 87 mg/dL (ref <200)
HDL: 43 mg/dL — ABNORMAL LOW (ref >=50)
LDL Cholesterol (Calc): 24 mg/dL (calc)
Non-HDL Cholesterol (Calc): 44 mg/dL (calc) (ref <130)
Total CHOL/HDL Ratio: 2 (calc) (ref <5.0)
Triglycerides: 122 mg/dL (ref <150)

## 2022-06-22 LAB — RPR: RPR Ser Ql: NONREACTIVE

## 2022-06-22 LAB — HIV-1 RNA QUANT-NO REFLEX-BLD
HIV 1 RNA Quant: 71 Copies/mL — ABNORMAL HIGH
HIV-1 RNA Quant, Log: 1.85 Log cps/mL — ABNORMAL HIGH

## 2022-07-25 ENCOUNTER — Non-Acute Institutional Stay: Payer: Medicaid Other | Admitting: Family Medicine

## 2022-07-25 VITALS — BP 130/78 | HR 81 | Temp 98.2°F | Resp 18

## 2022-07-25 DIAGNOSIS — F250 Schizoaffective disorder, bipolar type: Secondary | ICD-10-CM

## 2022-07-25 DIAGNOSIS — L89154 Pressure ulcer of sacral region, stage 4: Secondary | ICD-10-CM

## 2022-07-25 DIAGNOSIS — B20 Human immunodeficiency virus [HIV] disease: Secondary | ICD-10-CM

## 2022-07-25 NOTE — Progress Notes (Unsigned)
[facility] Consult Note Telephone: [phone]  Fax: [phone]    Date of encounter: 07/25/22 11:58 AM PATIENT NAME: Karen Mcguire [address] [place] [address]   [phone] (home)  DOB: October 13, 1965 MRN: [id number] PRIMARY CARE PROVIDER:    Lucia Gaskins, MD (Inactive),  No address on file None  REFERRING PROVIDER:   No referring provider defined for this encounter. N/A  RESPONSIBLE PARTY:    Contact Information     Name Relation Home Work [place]   [place]  [phone]     [place]  [phone]         I met face to face with patient in her skilled nursing facility. Palliative Care was asked to follow this patient by consultation request of  No ref. provider found to address advance care planning and complex medical decision making. This is a follow up visit   ASSESSMENT , SYMPTOM MANAGEMENT AND PLAN / RECOMMENDATIONS:  HIV disease Concern for possible development of resistance vs non-compliance with significant elevation of viral load. Continue follow up as indicated per Infectious Disease with change from Triumeq to [employer].  2.  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  Sacral decubitus ulcer, stage IV Pt resistive to facility staff doing dressing changes/wound care and non-compliant with offloading. Has severe protein calorie malnutrition which severely impacts wound healing.  4.  Encounter for follow up with Palliative Care Will follow up next ID note but may be appropriate to review goals of care with DSS guardian and possible hospice referral.   Advance Care Planning/Goals of Care: Identification of a healthcare  agent-has Karen Mcguire [phone] ext [phone]/ after hrs/crisis [phone]. Review of an existing advance directive document -MOST.  CODE STATUS: MOST as of 05/26/21: Full Code with Full interventions if indicated      Follow up Palliative Care Visit: Palliative care will continue to follow for complex medical decision making, advance care planning, and clarification of goals. Return 4 weeks or prn.   This visit was coded based on medical decision making (MDM).  PPS: 30%  HOSPICE ELIGIBILITY/DIAGNOSIS: TBD  Chief Complaint:  Palliative Care is continuing to follow patient for chronic medical management in setting of HIV with stage IV sacral wound with BLE paralysis after pt made a suicide attempt over the weekend.    HISTORY OF PRESENT ILLNESS:  Karen Mcguire is a 57 y.o. year old female with HIV, hx of epidural abscess with BLE paralysis, stage IV sacral decubitus.  Nursing staff says she is total care except for feeding and incontinent of bowel and bladder, intermittently will refuse medications.  She was sent for emergent ED visit over the weekend for suicide attempt by trying to wrap her neck in an emergency cord. She was returned to the facility on the same day.  Nursing states she had been doing better with 30 day injectable Risperdal and BID dosing oral Risperdal so they are not sure about provoking incident for suicide attempt.  Pt was evaluated in ED by telepsych who spoke with pt that told them it was an impulsive reaction in response to a social situation at the facility.  She denied any continuing thoughts of harming herself and psych indicated that this  was typical for her with cluster B traits but pt was able to identify methods of coping for similar social situations and asked to be returned to the facility.  Psych did not believe she was in immediate danger or suicidal any longer so she was returned to the facility. Pt having her hair done by facility staff  today.  She denies pain, SOB.  She states she wrapped her call bell cord around her neck because a resident named "Karen Mcguire" was bothering her and scared her. She jumps from saying she doesn't want to die to liking men, liking provider's jewelry, and wanting a package or remote for her TV.  She begins to repeat that she does not want "Karen Mcguire" on the floor with her.   History obtained from review of EMR, discussion with facility staff and/or Karen Mcguire.       Latest Ref Rng & Units 03/15/2022    9:00 AM 11/21/2021    8:56 PM 11/23/2020    3:31 PM  CBC  WBC 3.8 - 10.8 Thousand/uL 6.3  7.5  10.2   Hemoglobin 11.7 - 15.5 g/dL 10.2  9.3  13.4   Hematocrit 35.0 - 45.0 % 32.3  28.6  39.5   Platelets 140 - 400 Thousand/uL 442  438  283        Latest Ref Rng & Units 06/20/2022    9:45 AM 03/15/2022    9:00 AM 11/21/2021    8:56 PM  CMP  Glucose 65 - 99 mg/dL 123  77  118   BUN 7 - 25 mg/dL _0 Creatinine 0.50 - 1.03 mg/dL 0.72  0.49  0.61   Sodium 135 - 146 mmol/L 137  140  140   Potassium 3.5 - 5.3 mmol/L 4.5  4.3  4.4   Chloride 98 - 110 mmol/L 104  106  110   CO2 20 - 32 mmol/L _1 Calcium 8.6 - 10.4 mg/dL 9.8  9.7  9.2   Total Protein 6.1 - 8.1 g/dL 7.8  7.5  7.8   Total Bilirubin 0.2 - 1.2 mg/dL 0.3  0.3  0.1   Alkaline Phos 38 - 126 U/L   106   AST 10 - 35 U/L _2 ALT 6 - 29 U/L _3 T-helper cells (CD4) count (not at [facility]) Order: [id number] Status: Final result     Visible to patient: No (inaccessible in MyChart)     Next appt: 09/20/2022 at 09:45 AM in Infectious Diseases Karen Evener, MD)     Dx: Human immunodeficiency virus (HIV) di...   0 Result Notes           Component Ref Range & Units 1 mo ago 4 mo ago 1 yr ago 2 yr ago 4 yr ago 5 yr ago 6 yr ago  CD4 T Cell Abs 400 - 1,790 /uL 776 1,138 1,138 1,131 1,340 R 1,440 R 840 R  CD4 % Helper T Cell 33 - 65 % 58 56 CM 57 CM 49 CM 48 R, CM 47 R, CM 49 R, CM  Comment: Performed at [facility], [place] [address] [place], [place] [address]  Resulting Agency  _4  [facility]         Specimen Collected: 06/20/22  09:19 Last Resulted: 06/21/22 13:01      Lab Flowsheet      Order Details      View Encounter      Lab and Collection Details      Routing      Result History    View All Conversations on this Encounter      CM=Additional comments  R=Reference range differs from displayed range      Result Care Coordination   Patient Communication   Add Comments   Not seen Back to Top      Other Results from 06/20/2022   Contains abnormal data HIV-1 RNA quant-no reflex-bld Order: [id number] Status: Final result      Visible to patient: No (inaccessible in MyChart)      Next appt: 09/20/2022 at 09:45 AM in Infectious Diseases Karen Evener, MD)      Dx: Human immunodeficiency virus (HIV) di...    0 Result Notes             Component Ref Range & Units 1 mo ago 3 mo ago 4 mo ago 8 mo ago 10 mo ago 1 yr ago 2 yr ago  HIV 1 RNA Quant Copies/mL 71 High  47 High  R 1,150 High  31 High  R 54 High  56 High  65 High  R  HIV-1 RNA Quant, Log Log cps/mL 1.85 High  1.67 High  R, CM 3.06 High  CM 1.49 High  R, CM 1.73 High  CM 1.75 High  CM 1.81 High  R, CM  Comment: . Reference Range:                           Not Detected     copies/mL                           Not Detected Log copies/mL . Marland Kitchen The test was performed using Real-Time Polymerase Chain Reaction. . . Reportable Range: 20 copies/mL to 10,000,000 copies/mL (1.30 Log copies/mL to 7.00 Log copies/mL). .  Resulting Agency  [facility]             I reviewed EMR for available labs, medications, imaging, studies and related documents.  Records reviewed and summarized above.   ROS General: NAD Cardiovascular: denies chest pain, denies DOE Pulmonary: denies cough, denies SOB Abdomen:  endorses good appetite, denies constipation, staff endorse incontinence of bowel GU: denies dysuria, staff endorse incontinence of urine MSK: staff notes paralysis of BLE Skin: denies rashes or wounds Neurological: denies pain and insomnia Psych: intermittent refusal of medications, perseverates, tangential thought process Heme/lymph/immuno: denies bruises, abnormal bleeding  Physical Exam: Current and past weights: 118 lbs 6.4 ounces as of 03/15/22, weight 138.6 lbs as of 11/21/21 Constitutional: NAD General: thin  CV: S1S2, RRR, no LE edema Pulmonary: CTAB, no increased work of breathing, no cough, room air Abdomen: normo-active BS + 4 quadrants, soft and non tender GU: deferred MSK: no sarcopenia, moves upper extremities, bedbound/chairbound Skin: warm and dry, no rashes or wounds on visible skin Neuro:  paralysis of BLE,  no cognitive impairment Psych: non-anxious affect, A and O x 3 Hem/lymph/immuno: no widespread bruising   Thank you for the opportunity to participate in the care of Karen Mcguire.  The palliative care team will continue to follow. Please call our office at [phone] if we can be of additional assistance.  Karen Conception, FNP -C  COVID-19 PATIENT SCREENING TOOL Asked and negative response unless otherwise noted:   Have you had symptoms of covid, tested positive or been in contact with someone with symptoms/positive test in the past 5-10 days?  Unknown, covid in facility

## 2022-07-26 ENCOUNTER — Encounter: Payer: Self-pay | Admitting: Family Medicine

## 2022-07-30 NOTE — ED Notes (Signed)
 [facility] contacted for transfer back to [facility].

## 2022-09-20 ENCOUNTER — Ambulatory Visit: Payer: Medicaid Other | Admitting: Infectious Disease
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
## 2022-10-17 ENCOUNTER — Non-Acute Institutional Stay: Payer: Medicaid Other | Admitting: Family Medicine

## 2022-10-17 ENCOUNTER — Encounter: Payer: Self-pay | Admitting: Family Medicine

## 2022-10-17 VITALS — BP 138/72 | HR 88 | Temp 97.2°F | Resp 18

## 2022-10-17 DIAGNOSIS — B20 Human immunodeficiency virus [HIV] disease: Secondary | ICD-10-CM

## 2022-10-17 DIAGNOSIS — L89154 Pressure ulcer of sacral region, stage 4: Secondary | ICD-10-CM

## 2022-10-17 NOTE — Progress Notes (Signed)
[facility] Consult Note Telephone: [phone]  Fax: [phone]   Date of encounter: 10/17/22 2:30 PM PATIENT NAME: Karen Mcguire [address] [place] [address]   [phone] (home)  DOB: 10/04/1965 MRN: [id number] PRIMARY CARE PROVIDER:    Lucia Gaskins, MD (Inactive),  No address on file None  REFERRING PROVIDER:   No referring provider defined for this encounter. N/A  Health Care Agent/Legal Guardian:   Karen Mcguire Information     Name Relation Home Work Diaz   [place]armine  [phone]     [place]  [phone]         I met face to face with patient in [facility] and Rehab facility. Palliative Care was asked to follow this patient by consultation request of No ref. provider found to address advance care planning and complex medical decision making. This is a follow up visit.   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 Next visit in 3-4 weeks or prn.  This visit was coded based on medical decision making (MDM).  Chief Complaint  Palliative Care is continuing to follow patient for chronic management of symptoms in setting of HIV with sacral osteomyelitis.  HISTORY OF PRESENT ILLNESS:  Karen Mcguire is a 57 y.o. year old female with HIV, bedbound, quadriplegic with BLE contracture, has a stage IV sacral decubitus ulcer which is chronic and has chronic osteomyelitis and has completed IV antibiotics by PICC line and was told that pt will never completely resolve her osteomyelitis. Facility staff indicates no further expression of suicidal ideation since pt was started on Risperdal consta injections.   ACTIVITIES OF DAILY LIVING: CONTINENT OF BLADDER/ BOWEL? No, has foley cath in place to promote wound healing  MOBILITY:   BEDBOUND?  APPETITE? Good  CURRENT PROBLEM LIST:  Patient Active Problem List   Diagnosis Date Noted   Epidural abscess [id number]   Hardware complicating wound infection ([place]) 06/20/2022   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   Encounter for palliative care 12/04/2021   Abscess in epidural space of cervical spine 10/20/2021   Quadriplegia ([place]) 10/20/2021   Sacral decubitus ulcer, stage IV ([place]) 10/20/2021   Unintentional weight loss 11/23/2020   Poor dentition 11/27/2019   Polysubstance abuse ([place]) 10/22/2013   Skull fracture ([place]) 10/22/2013   Dyslipidemia 10/22/2013   Human immunodeficiency virus (HIV) disease ([place]) 01/29/2009   Schizoaffective disorder ([place]) 01/29/2009   Borderline personality disorder ([place]) 01/29/2009   MILD MENTAL RETARDATION 01/29/2009   ALLERGIC RHINITIS, SEASONAL 01/29/2009   PAST MEDICAL HISTORY:  Active Ambulatory Problems    Diagnosis Date Noted   Human immunodeficiency virus (HIV) disease ([place]) 01/29/2009   Schizoaffective disorder ([place]) 01/29/2009   Borderline personality disorder ([place]) 01/29/2009   MILD MENTAL RETARDATION 01/29/2009   ALLERGIC RHINITIS, SEASONAL 01/29/2009   Polysubstance abuse ([place]) 10/22/2013   Skull fracture ([place]) 10/22/2013  Dyslipidemia 10/22/2013   Poor dentition 11/27/2019   Unintentional weight loss 11/23/2020   Abscess in epidural space of cervical spine 10/20/2021   Quadriplegia ([place])  10/20/2021   Sacral decubitus ulcer, stage IV ([place]) 10/20/2021   Encounter for palliative care 12/04/2021   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   Epidural abscess [id number]   Hardware complicating wound infection ([place]) 06/20/2022   Resolved Ambulatory Problems    Diagnosis Date Noted   HEPATITIS B 01/29/2009   Hepatitis C 10/15/2013   Past Medical History:  Diagnosis Date   Bipolar disorder ([place])    Hepatitis B carrier ([place])    Hepatitis C without mention of hepatic coma    History of traumatic head injury    HIV infection ([place])    MR (mental retardation), moderate      Preferred Pharmacy: ALLERGIES:  Allergies  Allergen Reactions   Penicillins Swelling    12/02/21 Pt has received Cephalosporin and currently is taking Amoxicillin without difficulty.    06/20/22 per SNF record, "no known allergies"     PERTINENT MEDICATIONS:  Outpatient Encounter Medications as of 10/17/2022  Medication Sig   atorvastatin (LIPITOR) 10 MG tablet Take 10 mg by mouth at bedtime.   LORazepam (ATIVAN) 0.5 MG tablet Take 0.5 mg by mouth every 8 (eight) hours.   Amino Acids-Protein Hydrolys (PRO-STAT) LIQD Take 30 mLs by mouth 2 (two) times daily.   amoxicillin (AMOXIL) 500 MG tablet Take 500 mg by mouth 2 (two) times daily. 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   bictegravir-emtricitabine-tenofovir AF (BIKTARVY) 50-200-25 MG TABS tablet Take 1 tablet by mouth daily.   ferrous sulfate 325 (65 FE) MG tablet Take 325 mg by mouth daily.   FLUDROCORTISONE ACETATE PO Take 0.1 mg by mouth daily.   folic acid (FOLVITE) A999333 MCG tablet Take 800 mcg by mouth daily.   MIDODRINE HCL PO Take 5 mg by mouth 2 (two) times daily.   Multiple Vitamin (MULTIVITAMIN ADULT PO) Take 1 tablet by mouth daily.   pantoprazole (PROTONIX) 40 MG tablet Take 40 mg by mouth daily.   Potassium Chloride Crys ER (KLOR-CON M20 PO) Take 1 tablet by mouth 2  (two) times daily.   RISPERDAL 1 MG tablet Take 1 mg by mouth 3 (three) times daily. Hold if over-sedated   risperiDONE microspheres (RISPERDAL CONSTA) 25 MG injection Inject 25 mg into the muscle every 14 (fourteen) days.   Thiamine HCl (THIAMINE PO) Take 100 mg by mouth daily.   traMADol (ULTRAM) 50 MG tablet Take 50 mg by mouth every 6 (six) hours as needed for moderate pain.   zinc gluconate 50 MG tablet Take 50 mg by mouth daily.   [DISCONTINUED] Pitavastatin Magnesium 4 MG TABS Take 4 mg by mouth daily.   No facility-administered encounter medications on file as of 10/17/2022.    History obtained from review of EMR, discussion with facility staff/caregiver and/or patient.    I reviewed available labs, medications, imaging, studies and related documents from the EMR.  There were no new records/imaging since last visit.   Physical Exam: GENERAL: NAD LUNGS: CTAB, no increased work of breathing, room air CARDIAC:  S1S2, RRR with left sternal border murmur, No edema/ cyanosis ABD:  Normo-active BS x 4 quads, soft, non-tender EXTREMITIES: Contracture of BLE, Yes BLE muscle atrophy NEURO:  Mild cognitive impairment  PSYCH:  non-anxious affect, A & O x 2  Thank you for the opportunity to participate in the care of Leyan Christos. Please call our main office at [phone] if we can be of additional assistance.    Damaris Hippo FNP-C  [email or web address]  Phone:  [phone]

## 2022-12-27 ENCOUNTER — Encounter: Payer: Self-pay | Admitting: Family Medicine

## 2022-12-27 ENCOUNTER — Non-Acute Institutional Stay: Payer: Medicaid Other | Admitting: Family Medicine

## 2022-12-27 VITALS — BP 124/76 | HR 85 | Temp 97.3°F | Resp 18

## 2022-12-27 DIAGNOSIS — L89154 Pressure ulcer of sacral region, stage 4: Secondary | ICD-10-CM

## 2022-12-27 DIAGNOSIS — F603 Borderline personality disorder: Secondary | ICD-10-CM
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
## 2023-05-25 DEATH — deceased

## 2023-12-29 IMAGING — DX DG CHEST 1V
1 series · 1 of 1 positions shown · non-contrast
Comparison: CT chest, abdomen and pelvis with contrast 05/19/2021.

CLINICAL DATA: Concern for sepsis, altered mental status.

EXAM:
CHEST  1 VIEW

[chest ap]
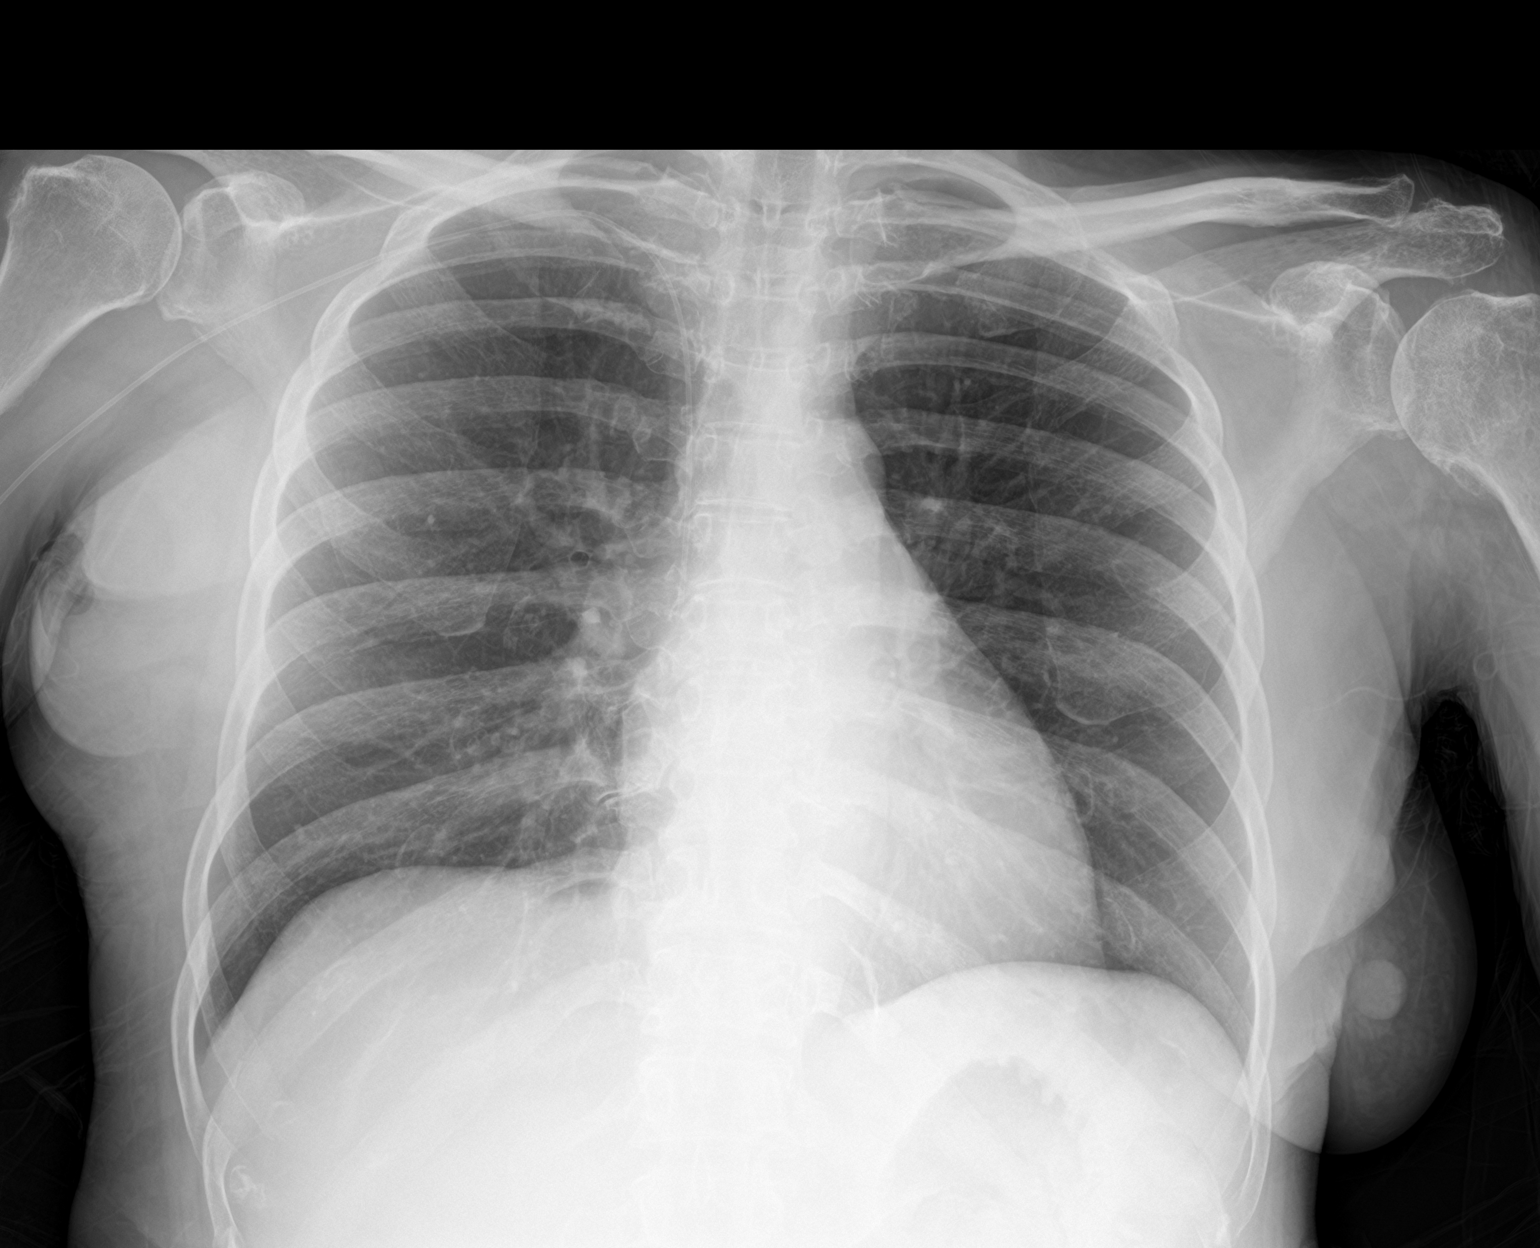

[1 of 1 positions shown; findings below may reference images not displayed]

FINDINGS: The heart size and mediastinal contours are within normal limits.
Both lungs are clear apart from linear scarring or atelectasis in
the medial left base. The visualized skeletal structures are
unremarkable. A right PICC again terminates at the level of the
superior cavoatrial junction.
IMPRESSION: No active disease.  Stable chest.

## 2024-04-02 ENCOUNTER — Telehealth: Payer: Self-pay

## 2024-04-02 NOTE — Telephone Encounter (Signed)
 [facility] to see if patient was still a resident there. They report she passed away 2023-05-31.  Sendy Pluta, BSN, RN
